# Patient Record
Sex: Female | Born: 1970 | Race: Black or African American | Hispanic: No | Marital: Married | Smoking: Former smoker
Health system: Southern US, Community
[De-identification: ages and names within clinical notes are randomized; demographics above are authoritative.]

## PROBLEM LIST (undated history)

## (undated) DIAGNOSIS — N83209 Unspecified ovarian cyst, unspecified side: Secondary | ICD-10-CM

## (undated) DIAGNOSIS — D259 Leiomyoma of uterus, unspecified: Secondary | ICD-10-CM

## (undated) DIAGNOSIS — D649 Anemia, unspecified: Secondary | ICD-10-CM

## (undated) DIAGNOSIS — K219 Gastro-esophageal reflux disease without esophagitis: Secondary | ICD-10-CM

## (undated) HISTORY — PX: LAPAROSCOPY: SHX197

---

## 2006-08-16 ENCOUNTER — Emergency Department (HOSPITAL_COMMUNITY): Admission: EM | Admit: 2006-08-16 | Discharge: 2006-08-16 | Payer: Self-pay | Admitting: Family Medicine

## 2007-08-06 ENCOUNTER — Ambulatory Visit: Payer: Self-pay | Admitting: Oncology

## 2007-08-08 ENCOUNTER — Inpatient Hospital Stay (HOSPITAL_COMMUNITY): Admission: AD | Admit: 2007-08-08 | Discharge: 2007-08-08 | Payer: Self-pay | Admitting: Obstetrics

## 2007-08-13 ENCOUNTER — Emergency Department (HOSPITAL_COMMUNITY): Admission: EM | Admit: 2007-08-13 | Discharge: 2007-08-14 | Payer: Self-pay | Admitting: Emergency Medicine

## 2007-08-14 ENCOUNTER — Ambulatory Visit: Payer: Self-pay | Admitting: Oncology

## 2007-09-05 ENCOUNTER — Ambulatory Visit: Payer: Self-pay | Admitting: Oncology

## 2007-11-03 ENCOUNTER — Emergency Department (HOSPITAL_COMMUNITY): Admission: EM | Admit: 2007-11-03 | Discharge: 2007-11-03 | Payer: Self-pay | Admitting: Family Medicine

## 2008-07-08 ENCOUNTER — Ambulatory Visit (HOSPITAL_COMMUNITY): Admission: RE | Admit: 2008-07-08 | Discharge: 2008-07-08 | Payer: Self-pay | Admitting: Obstetrics & Gynecology

## 2008-07-23 ENCOUNTER — Ambulatory Visit (HOSPITAL_COMMUNITY): Admission: RE | Admit: 2008-07-23 | Discharge: 2008-07-23 | Payer: Self-pay | Admitting: Obstetrics & Gynecology

## 2008-11-01 ENCOUNTER — Emergency Department (HOSPITAL_COMMUNITY): Admission: EM | Admit: 2008-11-01 | Discharge: 2008-11-02 | Payer: Self-pay | Admitting: Emergency Medicine

## 2009-03-22 ENCOUNTER — Emergency Department (HOSPITAL_COMMUNITY): Admission: EM | Admit: 2009-03-22 | Discharge: 2009-03-22 | Payer: Self-pay | Admitting: Emergency Medicine

## 2009-04-03 IMAGING — US US TRANSVAGINAL NON-OB
1 series · 13 of 25 positions shown · non-contrast
Comparison: None

CLINICAL DATA: Right lower quadrant pain.  Evaluate for ovarian
cyst.  LMP 07/01/2008

TRANSABDOMINAL AND TRANSVAGINAL ULTRASOUND OF PELVIS
TECHNIQUE: Both transabdominal and transvaginal ultrasound
examinations of the pelvis were performed including evaluation of
the uterus, ovaries, adnexal regions, and pelvic cul-de-sac.

[Series 1: us transvaginal non-ob · 13 of 73 slices shown]
[im 1/73]
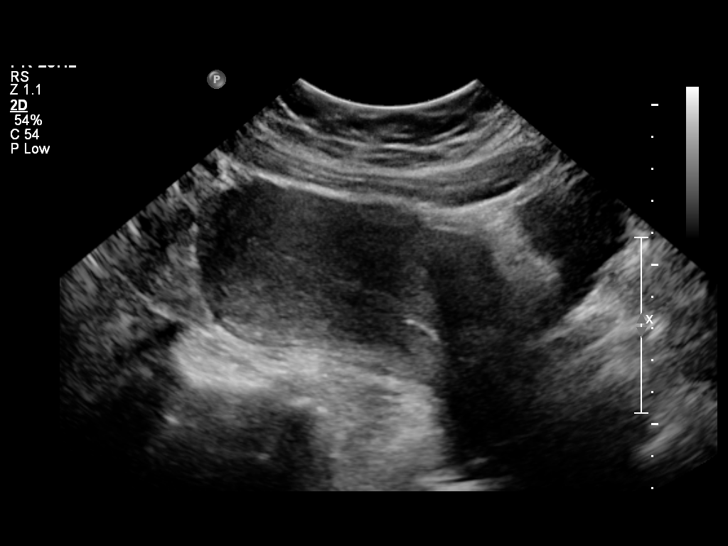
[im 7/73]
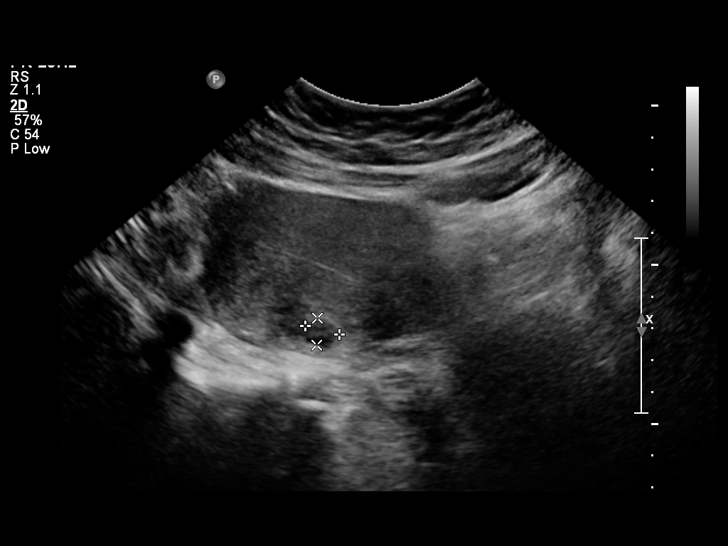
[im 13/73]
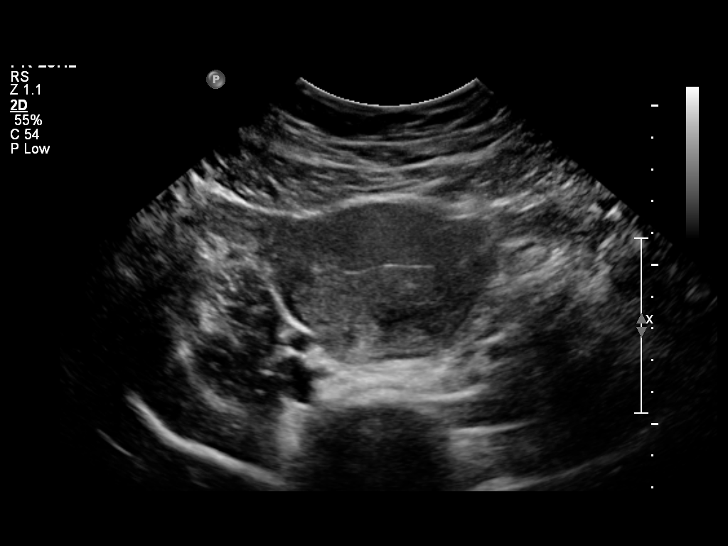
[im 19/73]
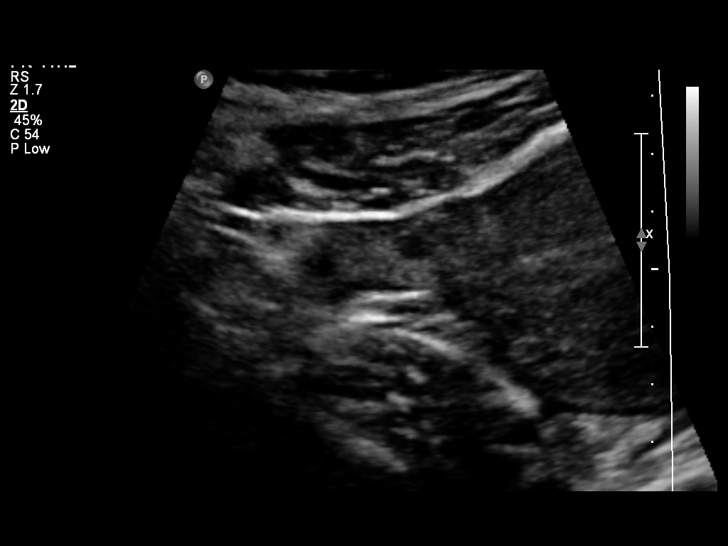
[im 25/73]
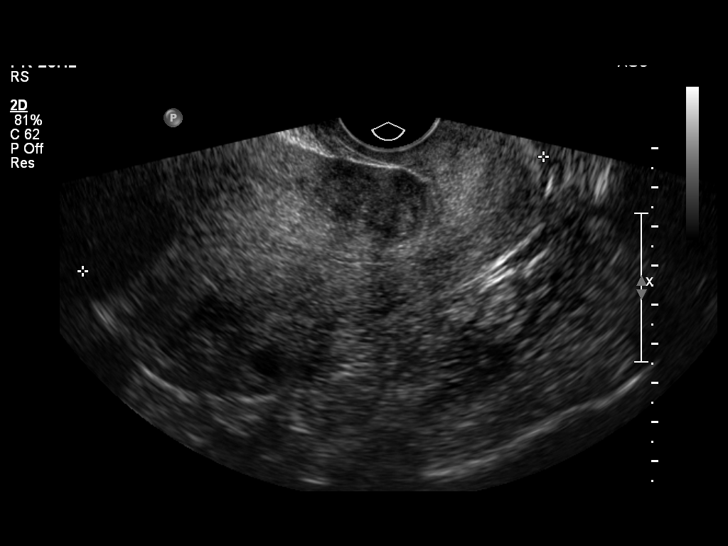
[im 31/73]
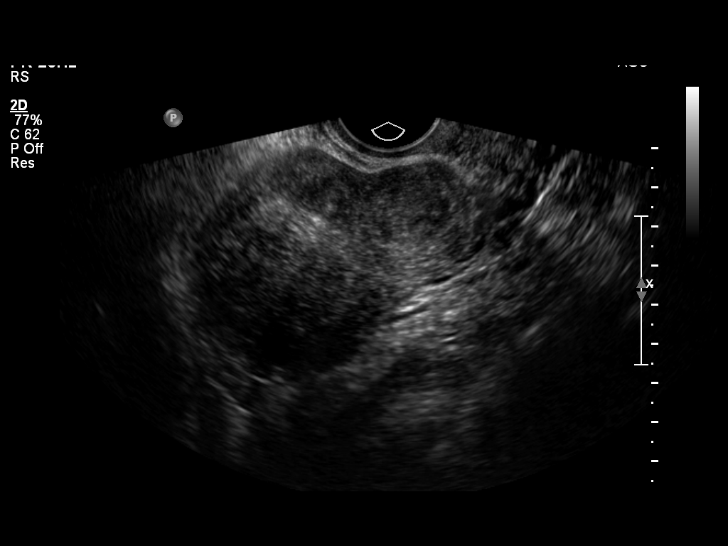
[im 37/73]
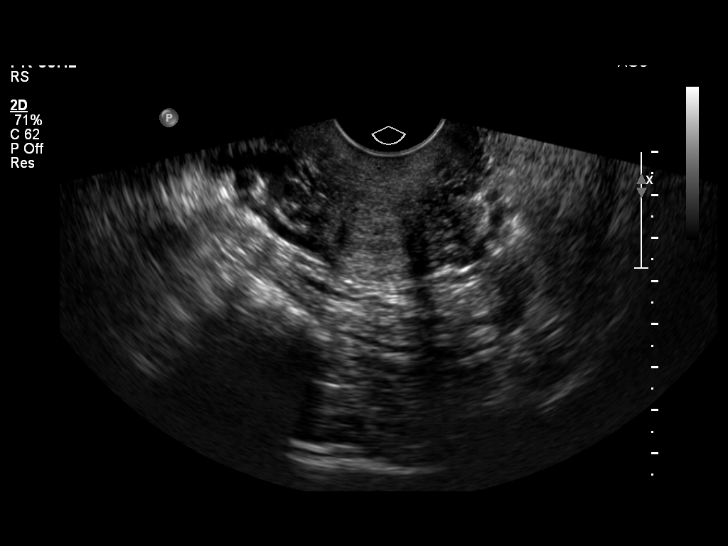
[im 43/73]
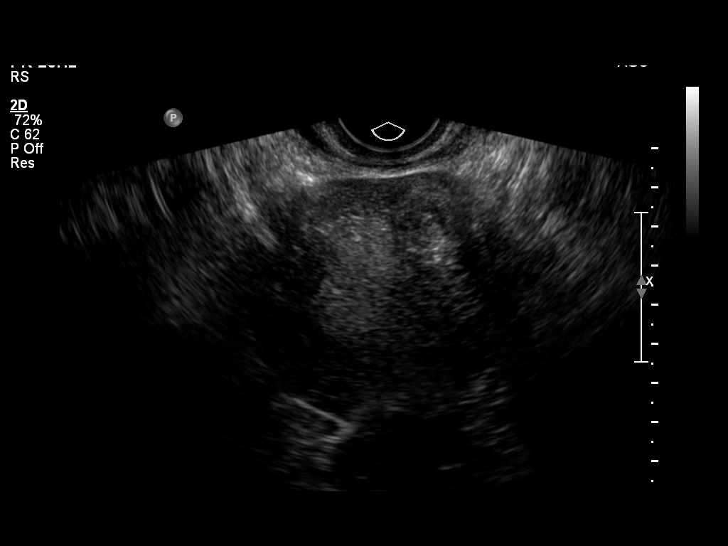
[im 49/73]
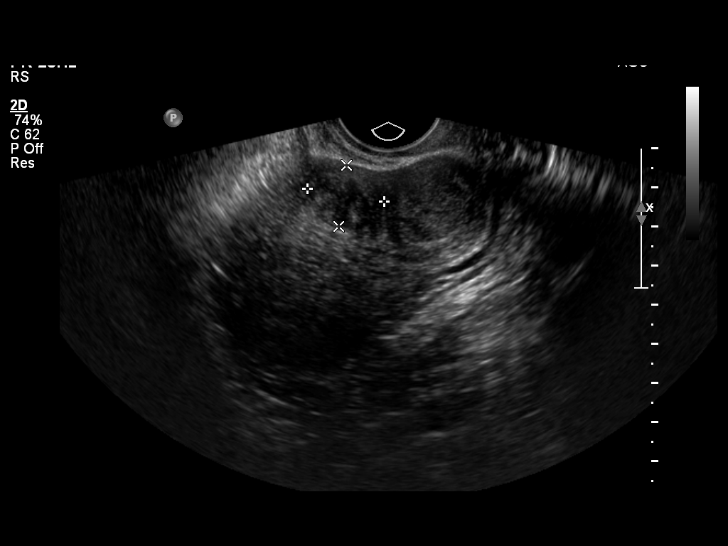
[im 55/73]
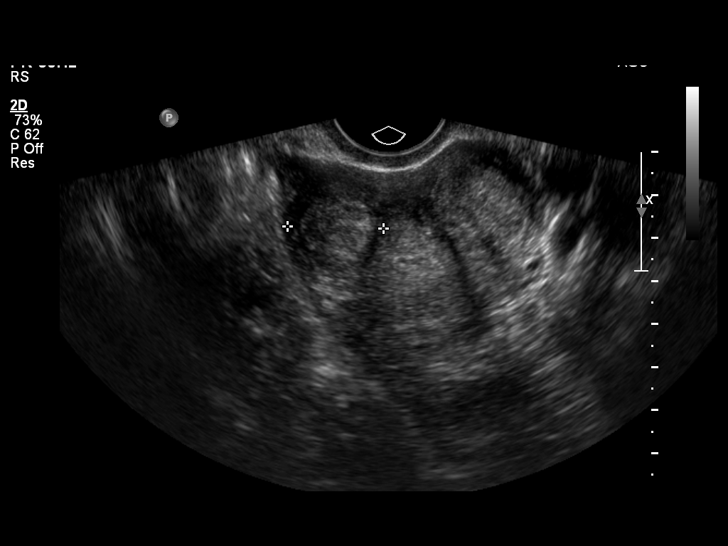
[im 61/73]
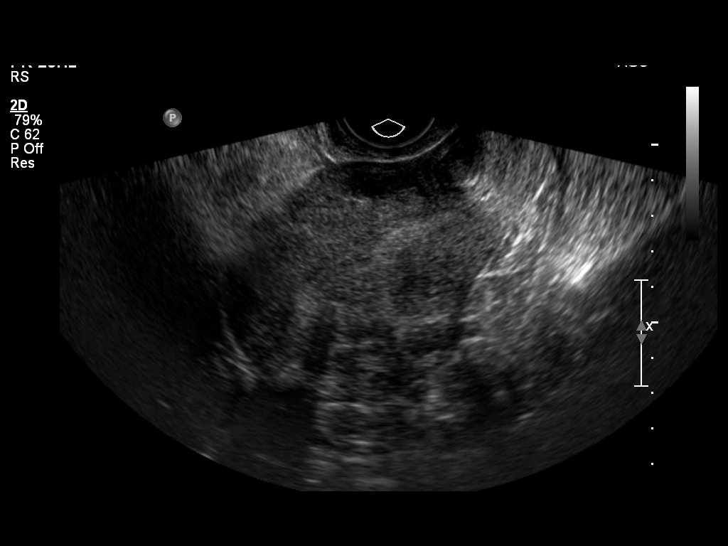
[im 67/73]
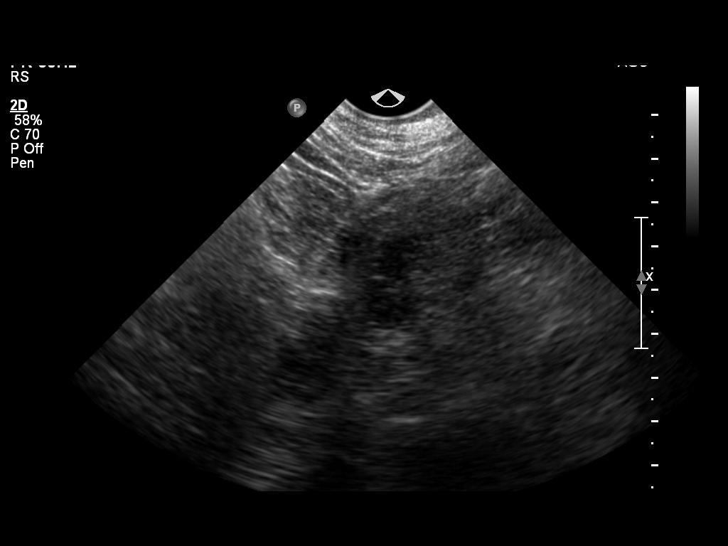
[im 73/73]
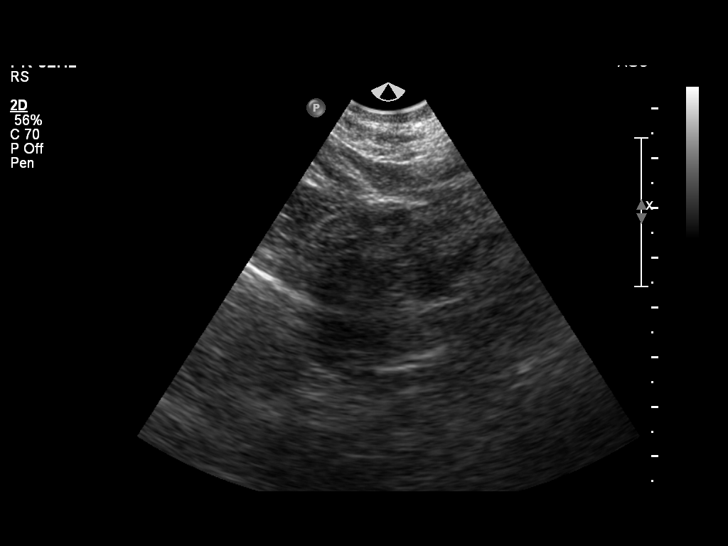

[13 of 25 positions shown; findings below may reference images not displayed]

FINDINGS: The uterus has a sagittal length of 12.1 cm, an AP width
of 6.5 cm and a transverse width of 6.7 cm.  Multiple focal
fibroids are identified and have sizes and locations as follows:
In the anterior left lateral lower uterine segment measuring 3.0 x
2.2 x 2.7 cm and mural in location, in the left anterolateral upper
uterine segment measuring 2.0 x 1.6 x 1.9 cm and mural in location,
in the anterolateral right lower uterine segment measuring 1.7 x
1.4 x 2.2 cm and mural in location, in the right lateral mid body
measuring 2.7 x 2.4 x 2.2 cm and mural in location, in the
posterior and right lateral upper uterine segment measuring 1.4 x
0.8 x 1.5 cm and mural in location.  A small fibroid is identified
centrally 7 x 6 x 7 mm and appearing to be predominantly submucosal
in location.

The endometrial lining with the exception of the small focal
fibroid is homogeneously echogenic with an AP width of 9.5 mm.  No
areas of focal thickening or inhomogeneity are noted.

Both ovaries are seen and have a normal appearance of the right
ovary measuring 3.1 x 1.3 x 2.2 cm and the left ovary measuring
x 1.7 x 2.5 cm.

No pelvic fluid is seen.
IMPRESSION: Fibroid uterus with fibroid sizes and locations as noted above.
One small fibroid appears to be submucosal in location.  This can
be confirmed with sono hysterography if desired.  Normal ovaries.

## 2009-05-04 ENCOUNTER — Emergency Department (HOSPITAL_COMMUNITY): Admission: EM | Admit: 2009-05-04 | Discharge: 2009-05-04 | Payer: Self-pay | Admitting: Family Medicine

## 2009-05-26 ENCOUNTER — Emergency Department (HOSPITAL_COMMUNITY): Admission: EM | Admit: 2009-05-26 | Discharge: 2009-05-26 | Payer: Self-pay | Admitting: Family Medicine

## 2009-08-08 ENCOUNTER — Emergency Department (HOSPITAL_COMMUNITY): Admission: EM | Admit: 2009-08-08 | Discharge: 2009-08-08 | Payer: Self-pay | Admitting: Family Medicine

## 2009-08-19 ENCOUNTER — Encounter: Admission: RE | Admit: 2009-08-19 | Discharge: 2009-08-19 | Payer: Self-pay | Admitting: Orthopedic Surgery

## 2010-05-28 ENCOUNTER — Encounter: Payer: Self-pay | Admitting: Obstetrics & Gynecology

## 2010-07-26 ENCOUNTER — Emergency Department (INDEPENDENT_AMBULATORY_CARE_PROVIDER_SITE_OTHER): Payer: 59

## 2010-07-26 ENCOUNTER — Emergency Department (HOSPITAL_BASED_OUTPATIENT_CLINIC_OR_DEPARTMENT_OTHER)
Admission: EM | Admit: 2010-07-26 | Discharge: 2010-07-26 | Disposition: A | Discharge status: Home / Self Care | Payer: 59 | Attending: Emergency Medicine | Admitting: Emergency Medicine

## 2010-07-26 DIAGNOSIS — F17200 Nicotine dependence, unspecified, uncomplicated: Secondary | ICD-10-CM | POA: Insufficient documentation

## 2010-07-26 DIAGNOSIS — R1011 Right upper quadrant pain: Secondary | ICD-10-CM

## 2010-07-26 LAB — COMPREHENSIVE METABOLIC PANEL
BUN: 8 mg/dL (ref 6–23)
CO2: 25 mEq/L (ref 19–32)
Calcium: 9.5 mg/dL (ref 8.4–10.5)
Creatinine, Ser: 0.8 mg/dL (ref 0.4–1.2)
GFR calc Af Amer: >60 mL/min (ref >60)
GFR calc non Af Amer: >60 mL/min (ref >60)
Glucose, Bld: 94 mg/dL (ref 70–99)

## 2010-07-26 LAB — POCT PREGNANCY, URINE: Preg Test, Ur: NEGATIVE

## 2010-07-26 LAB — URINALYSIS, ROUTINE W REFLEX MICROSCOPIC
Ketones, ur: NEGATIVE mg/dL
Nitrite: NEGATIVE
Protein, ur: NEGATIVE mg/dL
Urobilinogen, UA: 0.2 mg/dL (ref 0.0–1.0)

## 2010-07-26 LAB — CBC
HCT: 35.7 % — ABNORMAL LOW (ref 36.0–46.0)
Hemoglobin: 12.1 g/dL (ref 12.0–15.0)
MCH: 28.5 pg (ref 26.0–34.0)
MCHC: 33.9 g/dL (ref 30.0–36.0)
MCV: 84.2 fL (ref 78.0–100.0)

## 2010-07-26 LAB — DIFFERENTIAL
Basophils Relative: 0 % (ref 0–1)
Eosinophils Absolute: 0.3 10*3/uL (ref 0.0–0.7)
Monocytes Absolute: 0.7 10*3/uL (ref 0.1–1.0)
Neutrophils Relative %: 35 % — ABNORMAL LOW (ref 43–77)

## 2010-08-09 LAB — POCT RAPID STREP A (OFFICE): Streptococcus, Group A Screen (Direct): NEGATIVE

## 2016-07-18 ENCOUNTER — Observation Stay (HOSPITAL_COMMUNITY)
Admission: EM | Admit: 2016-07-18 | Discharge: 2016-07-19 | Disposition: A | Discharge status: Home / Self Care | Attending: Internal Medicine | Admitting: Internal Medicine

## 2016-07-18 ENCOUNTER — Encounter (HOSPITAL_COMMUNITY): Payer: Self-pay

## 2016-07-18 ENCOUNTER — Observation Stay (HOSPITAL_COMMUNITY)

## 2016-07-18 DIAGNOSIS — R0789 Other chest pain: Secondary | ICD-10-CM | POA: Diagnosis not present

## 2016-07-18 DIAGNOSIS — Z8249 Family history of ischemic heart disease and other diseases of the circulatory system: Secondary | ICD-10-CM | POA: Diagnosis not present

## 2016-07-18 DIAGNOSIS — R1013 Epigastric pain: Secondary | ICD-10-CM | POA: Diagnosis present

## 2016-07-18 DIAGNOSIS — Z79899 Other long term (current) drug therapy: Secondary | ICD-10-CM | POA: Diagnosis not present

## 2016-07-18 DIAGNOSIS — D219 Benign neoplasm of connective and other soft tissue, unspecified: Secondary | ICD-10-CM | POA: Diagnosis present

## 2016-07-18 DIAGNOSIS — D500 Iron deficiency anemia secondary to blood loss (chronic): Secondary | ICD-10-CM | POA: Diagnosis not present

## 2016-07-18 DIAGNOSIS — E876 Hypokalemia: Secondary | ICD-10-CM | POA: Diagnosis not present

## 2016-07-18 DIAGNOSIS — K219 Gastro-esophageal reflux disease without esophagitis: Secondary | ICD-10-CM | POA: Diagnosis not present

## 2016-07-18 DIAGNOSIS — Z9102 Food additives allergy status: Secondary | ICD-10-CM | POA: Insufficient documentation

## 2016-07-18 DIAGNOSIS — R079 Chest pain, unspecified: Secondary | ICD-10-CM | POA: Diagnosis present

## 2016-07-18 DIAGNOSIS — N920 Excessive and frequent menstruation with regular cycle: Secondary | ICD-10-CM | POA: Diagnosis not present

## 2016-07-18 DIAGNOSIS — Z833 Family history of diabetes mellitus: Secondary | ICD-10-CM | POA: Diagnosis not present

## 2016-07-18 DIAGNOSIS — Z791 Long term (current) use of non-steroidal anti-inflammatories (NSAID): Secondary | ICD-10-CM | POA: Diagnosis not present

## 2016-07-18 DIAGNOSIS — D649 Anemia, unspecified: Secondary | ICD-10-CM | POA: Diagnosis not present

## 2016-07-18 DIAGNOSIS — Z720 Tobacco use: Secondary | ICD-10-CM

## 2016-07-18 DIAGNOSIS — R072 Precordial pain: Secondary | ICD-10-CM

## 2016-07-18 DIAGNOSIS — D259 Leiomyoma of uterus, unspecified: Secondary | ICD-10-CM | POA: Diagnosis present

## 2016-07-18 DIAGNOSIS — F17200 Nicotine dependence, unspecified, uncomplicated: Secondary | ICD-10-CM | POA: Diagnosis not present

## 2016-07-18 HISTORY — DX: Leiomyoma of uterus, unspecified: D25.9

## 2016-07-18 LAB — IRON AND TIBC
Iron: 20 ug/dL — ABNORMAL LOW (ref 28–170)
Saturation Ratios: 4 % — ABNORMAL LOW (ref 10.4–31.8)
TIBC: 477 ug/dL — ABNORMAL HIGH (ref 250–450)
UIBC: 457 ug/dL

## 2016-07-18 LAB — COMPREHENSIVE METABOLIC PANEL
ALT: 11 U/L — ABNORMAL LOW (ref 14–54)
ANION GAP: 7 (ref 5–15)
AST: 13 U/L — ABNORMAL LOW (ref 15–41)
Albumin: 4.1 g/dL (ref 3.5–5.0)
Alkaline Phosphatase: 37 U/L — ABNORMAL LOW (ref 38–126)
BUN: 5 mg/dL — ABNORMAL LOW (ref 6–20)
CHLORIDE: 105 mmol/L (ref 101–111)
CO2: 26 mmol/L (ref 22–32)
Calcium: 9.2 mg/dL (ref 8.9–10.3)
Creatinine, Ser: 0.89 mg/dL (ref 0.44–1.00)
GFR calc non Af Amer: >60 mL/min (ref >60)
Glucose, Bld: 117 mg/dL — ABNORMAL HIGH (ref 65–99)
POTASSIUM: 3.2 mmol/L — AB (ref 3.5–5.1)
Sodium: 138 mmol/L (ref 135–145)
Total Bilirubin: 0.3 mg/dL (ref 0.3–1.2)
Total Protein: 6.7 g/dL (ref 6.5–8.1)

## 2016-07-18 LAB — CBC
HCT: 23.8 % — ABNORMAL LOW (ref 36.0–46.0)
HCT: 23.3 % — ABNORMAL LOW (ref 36.0–46.0)
HEMOGLOBIN: 6.3 g/dL — AB (ref 12.0–15.0)
Hemoglobin: 6.5 g/dL — CL (ref 12.0–15.0)
MCH: 16.2 pg — ABNORMAL LOW (ref 26.0–34.0)
MCH: 17.8 pg — ABNORMAL LOW (ref 26.0–34.0)
MCHC: 26.5 g/dL — ABNORMAL LOW (ref 30.0–36.0)
MCHC: 27.9 g/dL — AB (ref 30.0–36.0)
MCV: 61.3 fL — AB (ref 78.0–100.0)
MCV: 63.8 fL — ABNORMAL LOW (ref 78.0–100.0)
PLATELETS: 658 10*3/uL — AB (ref 150–400)
PLATELETS: 544 10*3/uL — AB (ref 150–400)
RBC: 3.88 MIL/uL (ref 3.87–5.11)
RBC: 3.65 MIL/uL — ABNORMAL LOW (ref 3.87–5.11)
RDW: 19.9 % — ABNORMAL HIGH (ref 11.5–15.5)
RDW: 21.8 % — AB (ref 11.5–15.5)
WBC: 5.2 10*3/uL (ref 4.0–10.5)
WBC: 6.3 10*3/uL (ref 4.0–10.5)

## 2016-07-18 LAB — FERRITIN: Ferritin: 2 ng/mL — ABNORMAL LOW (ref 11–307)

## 2016-07-18 LAB — FOLATE: FOLATE: 17.9 ng/mL (ref >5.9)

## 2016-07-18 LAB — RETICULOCYTES
RBC.: 3.72 MIL/uL — AB (ref 3.87–5.11)
RETIC COUNT ABSOLUTE: 40.9 10*3/uL (ref 19.0–186.0)
RETIC CT PCT: 1.1 % (ref 0.4–3.1)

## 2016-07-18 LAB — PREPARE RBC (CROSSMATCH)

## 2016-07-18 LAB — TROPONIN I

## 2016-07-18 LAB — ABO/RH: ABO/RH(D): A POS

## 2016-07-18 LAB — VITAMIN B12: Vitamin B-12: 633 pg/mL (ref 180–914)

## 2016-07-18 LAB — POC OCCULT BLOOD, ED: FECAL OCCULT BLD: NEGATIVE

## 2016-07-18 MED ORDER — GI COCKTAIL ~~LOC~~
30.0000 mL | Freq: Once | ORAL | Status: AC
  Administered 2016-07-18: 30 mL via ORAL
  Filled 2016-07-18: qty 30

## 2016-07-18 MED ORDER — PANTOPRAZOLE SODIUM 40 MG PO TBEC
40.0000 mg | DELAYED_RELEASE_TABLET | Freq: Two times a day (BID) | ORAL | Status: DC
  Administered 2016-07-18 – 2016-07-19 (×2): 40 mg via ORAL
  Filled 2016-07-18 (×2): qty 1

## 2016-07-18 MED ORDER — SODIUM CHLORIDE 0.9 % IV SOLN
10.0000 mL/h | Freq: Once | INTRAVENOUS | Status: AC
  Administered 2016-07-18: 10 mL/h via INTRAVENOUS

## 2016-07-18 NOTE — ED Triage Notes (Addendum)
Pt states she was seen by her PCP for chest pain last week and told it was nothing cardiac but was called and told her Hgb was low. She reports it was less than 6. Pt reports increased fatigue. Pt denies rectal bleeding. Hx of ulcer.

## 2016-07-18 NOTE — ED Notes (Signed)
Patient transported to Ultrasound 

## 2016-07-18 NOTE — ED Provider Notes (Signed)
[facility] DEPT Provider Note   CSN: [id number] Arrival date & time: 07/18/16  1416     History   Chief Complaint Chief Complaint  Patient presents with  . Abnormal Lab    HPI Desiree Nunez is a 46 y.o. female.  HPI  46 y.o woman comes in with cc of low Hb. Pt has hx of fibroids and menorrhagia. She reports that for the past couple of months she has been having intermittent chest pain. Pt also has been feeling fatigued, so she went to her PCP and she was asked to come to the ER as her Hb was low.  Pt's chest pain is lower chest. She has some pain in her L arm as well. Pt's pain is fairly constant, and it is not worse with palpation, exertion or deep inspiration. Pt does smoke 4 cigarettes /day (used to smoke 2-3 ppd when younger) and has family hx of CAD at age 16s. Otherwise no medical problems.   Pt denies any heavy nsaid use, alcohol abuse, dark tarry stools. No active bleeding. ROS is + for rash to her tongue. No bleeding gingiva or diffuse bruising.  Past Medical History:  Diagnosis Date  . Uterine fibroid     There are no active problems to display for this patient.   History reviewed. No pertinent surgical history.  OB History    No data available       Home Medications    Prior to Admission medications   Medication Sig Start Date End Date Taking? Authorizing Provider  naproxen sodium (ANAPROX) 220 MG tablet Take 440 mg by mouth 2 (two) times daily as needed (for pain).   Yes Historical Provider, MD  ondansetron (ZOFRAN-ODT) 8 MG disintegrating tablet Take 8 mg by mouth every 8 (eight) hours as needed for nausea or vomiting.  07/17/16  Yes Historical Provider, MD    Family History History reviewed. No pertinent family history.  Social History Social History  Substance Use Topics  . Smoking status: Current Every Day Smoker    Packs/day: 0.50  . Smokeless tobacco: Never Used  . Alcohol use Yes     Comment: rare     Allergies   Strawberry  flavor   Review of Systems Review of Systems  Constitutional: Positive for fatigue.  Cardiovascular: Positive for chest pain.  Genitourinary: Positive for vaginal bleeding.  Skin: Positive for rash.   ROS 10 Systems reviewed and are negative for acute change except as noted in the HPI.     Physical Exam Updated Vital Signs BP 102/61   Pulse 64   Temp 99 F (37.2 C) (Oral)   Resp 18   LMP 07/01/2016 (Within Days)   SpO2 100%   Physical Exam  Constitutional: She is oriented to person, place, and time. She appears well-developed and well-nourished.  HENT:  Head: Normocephalic and atraumatic.  Eyes: EOM are normal. Pupils are equal, round, and reactive to light.  Neck: Neck supple.  Cardiovascular: Normal rate, regular rhythm and normal heart sounds.   No murmur heard. Pulmonary/Chest: Effort normal. No respiratory distress.  Abdominal: Soft. She exhibits no distension. There is no tenderness. There is no rebound and no guarding.  Neurological: She is alert and oriented to person, place, and time.  Skin: Skin is warm and dry.  Nursing note and vitals reviewed.    ED Treatments / Results  Labs (all labs ordered are listed, but only abnormal results are displayed) Labs Reviewed  COMPREHENSIVE METABOLIC PANEL - Abnormal; Notable  for the following:       Result Value   Potassium 3.2 (*)    Glucose, Bld 117 (*)    BUN 5 (*)    AST 13 (*)    ALT 11 (*)    Alkaline Phosphatase 37 (*)    All other components within normal limits  CBC - Abnormal; Notable for the following:    Hemoglobin 6.3 (*)    HCT 23.8 (*)    MCV 61.3 (*)    MCH 16.2 (*)    MCHC 26.5 (*)    RDW 19.9 (*)    Platelets 658 (*)    All other components within normal limits  RETICULOCYTES - Abnormal; Notable for the following:    RBC. 3.72 (*)    All other components within normal limits  VITAMIN B12  FOLATE  IRON AND TIBC  FERRITIN  TROPONIN I  TYPE AND SCREEN  ABO/RH  PREPARE RBC  (CROSSMATCH)    EKG  EKG Interpretation  Date/Time:  Wednesday July 18 2016 14:35:45 EDT Ventricular Rate:  77 PR Interval:  134 QRS Duration: 78 QT Interval:  366 QTC Calculation: 414 R Axis:   61 Text Interpretation:  Normal sinus rhythm Low voltage QRS Septal infarct , age undetermined Abnormal ECG No acute changes No old tracing to compare Confirmed by Kathrynn Humble, MD, Thelma Comp [phone]) on 07/18/2016 4:04:57 PM       Radiology No results found.  Procedures Procedures (including critical care time)  CRITICAL CARE Performed by: Varney Biles   Total critical care time: 35 minutes  Critical care time was exclusive of separately billable procedures and treating other patients.  Critical care was necessary to treat or prevent imminent or life-threatening deterioration.  Critical care was time spent personally by me on the following activities: development of treatment plan with patient and/or surrogate as well as nursing, discussions with consultants, evaluation of patient's response to treatment, examination of patient, obtaining history from patient or surrogate, ordering and performing treatments and interventions, ordering and review of laboratory studies, ordering and review of radiographic studies, pulse oximetry and re-evaluation of patient's condition.   Medications Ordered in ED Medications  0.9 %  sodium chloride infusion (10 mL/hr Intravenous New Bag/Given 07/18/16 1638)     Initial Impression / Assessment and Plan / ED Course  I have reviewed the triage vital signs and the nursing notes.  Pertinent labs & imaging results that were available during my care of the patient were reviewed by me and considered in my medical decision making (see chart for details).     Pt comes in with cc of low Hb. Pt is having symptomatic anemia - likely from chronic blood loss. We will start transfusion and admit to the hospital.    Final Clinical Impressions(s) / ED Diagnoses    Final diagnoses:  Symptomatic anemia  Precordial chest pain    New Prescriptions New Prescriptions   No medications on file     Varney Biles, MD 07/18/16 2117

## 2016-07-18 NOTE — ED Notes (Signed)
ED Provider at bedside. 

## 2016-07-18 NOTE — H&P (Addendum)
Desiree Nunez TZG:[id number] DOB: 05-Jun-1970 DOA: 07/18/2016     PCP: [facility]   Outpatient Specialists: OBGYN Patient coming from:    home Lives  With family    Chief Complaint: Low hemoglobin  HPI: Desiree Nunez is a 46 y.o. female with medical history significant of Fibroids metrorrhagia      Presented with progressive shortness of breath and occasional chest pain. She continues to smoke. Diffuse she felt it was muscular, She had some dull discomfort in her left arm. She also describes epigastric pain that has been persistent for days worse with eating improved after Zofran. Was associated with nausea just a bit. Patient has history of peptic ulcer disease years ago. Recently he has been taking NSAIDs. Denies melena states today her stool has been white. Endorses losing weight without trying. patient went to her doctor to have this checked out to routine blood work showed low hemoglobin and she was asked to present to emergency department. She have have heavy periods lasting 7 days. Goes through 10 pads.  Denies any rectal bleeding. Report PICA has been eating ice.  Reports dyspnea with exertion.  In  the past has been told that she has fibroids and twill follow up with OBGYN   IN ER:  Temp (24hrs), Avg:99 F (37.2 C), Min:98.6 F (37 C), Max:99.3 F (37.4 C)     RR 18 HR 64 BP 102/61 Ferritin 2 Retic 1.1  Na 138 K 3.2 WBC 5.2 PLT 658  Hgb 6.3   Following Medications were ordered in ER: Medications  0.9 %  sodium chloride infusion (10 mL/hr Intravenous New Bag/Given 07/18/16 1638)     ER provider discussed case with:[facility] was advised admission to the hospitalist service  Hospitalist was called for admission for symptomatic iron deficiency anemia  Review of Systems:    Pertinent positives include: Chest pain/Epigastric pain shortness of breath weight loss   Constitutional:  No weight loss, night sweats, Fevers, chills,  fatigue, HEENT:  No headaches, Difficulty swallowing,Tooth/dental problems,Sore throat,  No sneezing, itching, ear ache, nasal congestion, post nasal drip,  Cardio-vascular:  No  Orthopnea, PND, anasarca, dizziness, palpitations.no Bilateral lower extremity swelling  GI:  No heartburn, indigestion, abdominal pain, nausea, vomiting, diarrhea, change in bowel habits, loss of appetite, melena, blood in stool, hematemesis Resp:    No excess mucus, no productive cough, No non-productive cough, No coughing up of blood.No change in color of mucus.No wheezing. Skin:  no rash or lesions. No jaundice GU:  no dysuria, change in color of urine, no urgency or frequency. No straining to urinate.  No flank pain.  Musculoskeletal:  No joint pain or no joint swelling. No decreased range of motion. No back pain.  Psych:  No change in mood or affect. No depression or anxiety. No memory loss.  Neuro: no localizing neurological complaints, no tingling, no weakness, no double vision, no gait abnormality, no slurred speech, no confusion  As per HPI otherwise 10 point review of systems negative.   Past Medical History: Past Medical History:  Diagnosis Date  . Uterine fibroid    History reviewed. No pertinent surgical history.   Social History:  Ambulatory   Independently     reports that she has been smoking.  She has been smoking about 0.50 packs per day. She has never used smokeless tobacco. She reports that she drinks alcohol. Her drug history is not on file.  Allergies:   Allergies  Allergen Reactions  .  Strawberry Flavor Swelling       Family History:   Family History  Problem Relation Age of Onset  . CAD Father   . Hypertension Sister   . Diabetes Other   . Cancer Neg Hx     Medications: Prior to Admission medications   Medication Sig Start Date End Date Taking? Authorizing Provider  naproxen sodium (ANAPROX) 220 MG tablet Take 440 mg by mouth 2 (two) times daily as needed  (for pain).   Yes Historical Provider, MD  ondansetron (ZOFRAN-ODT) 8 MG disintegrating tablet Take 8 mg by mouth every 8 (eight) hours as needed for nausea or vomiting.  07/17/16  Yes Historical Provider, MD    Physical Exam: Patient Vitals for the past 24 hrs:  BP Temp Temp src Pulse Resp SpO2  07/18/16 1715 102/61 - - 64 18 100 %  07/18/16 1700 96/56 - - 66 21 100 %  07/18/16 1655 (!) 94/53 99 F (37.2 C) Oral 65 20 -  07/18/16 1645 104/59 - - 64 15 100 %  07/18/16 1640 101/80 99.3 F (37.4 C) Oral 66 18 -  07/18/16 1630 101/60 - - 69 19 100 %  07/18/16 1619 104/68 - - 68 15 99 %  07/18/16 1432 111/64 98.6 F (37 C) Oral 69 18 100 %    1. General:  in No Acute distress 2. Psychological: Alert and    Oriented 3. Head/ENT:    Dry Mucous Membranes pale                          Head Non traumatic, neck supple                          Normal  Dentition 4. SKIN:   decreased Skin turgor,  Skin clean Dry and intact no rash 5. Heart: Regular rate and rhythm no  Murmur, Rub or gallop 6. Lungs:  no wheezes or crackles   7. Abdomen: Soft,  Epigastric and RUQ tenderness, Non distended 8. Lower extremities: no clubbing, cyanosis, or edema 9. Neurologically Grossly intact, moving all 4 extremities equally   10. MSK: Normal range of motion   body mass index is unknown because there is no height or weight on file.  Labs on Admission:   Labs on Admission: I have personally reviewed following labs and imaging studies  CBC:  Recent Labs Lab 07/18/16 1439  WBC 5.2  HGB 6.3*  HCT 23.8*  MCV 61.3*  PLT 616*   Basic Metabolic Panel:  Recent Labs Lab 07/18/16 1439  NA 138  K 3.2*  CL 105  CO2 26  GLUCOSE 117*  BUN 5*  CREATININE 0.89  CALCIUM 9.2   GFR: CrCl cannot be calculated (Unknown ideal weight.). Liver Function Tests:  Recent Labs Lab 07/18/16 1439  AST 13*  ALT 11*  ALKPHOS 37*  BILITOT 0.3  PROT 6.7  ALBUMIN 4.1   No results for input(s): LIPASE,  AMYLASE in the last 168 hours. No results for input(s): AMMONIA in the last 168 hours. Coagulation Profile: No results for input(s): INR, PROTIME in the last 168 hours. Cardiac Enzymes: No results for input(s): CKTOTAL, CKMB, CKMBINDEX, TROPONINI in the last 168 hours. BNP (last 3 results) No results for input(s): PROBNP in the last 8760 hours. HbA1C: No results for input(s): HGBA1C in the last 72 hours. CBG: No results for input(s): GLUCAP in the last 168 hours. Lipid Profile:  No results for input(s): CHOL, HDL, LDLCALC, TRIG, CHOLHDL, LDLDIRECT in the last 72 hours. Thyroid Function Tests: No results for input(s): TSH, T4TOTAL, FREET4, T3FREE, THYROIDAB in the last 72 hours. Anemia Panel:  Recent Labs  07/18/16 1619  VITAMINB12 633  FOLATE 17.9  FERRITIN 2*  TIBC 477*  IRON 20*  RETICCTPCT 1.1    Sepsis Labs: @LABRCNTIP (procalcitonin:4,lacticidven:4) )No results found for this or any previous visit (from the past 240 hour(s)).    UA  not ordered  No results found for: HGBA1C  CrCl cannot be calculated (Unknown ideal weight.).  BNP (last 3 results) No results for input(s): PROBNP in the last 8760 hours.   ECG REPORT  Independently reviewed Rate: 77   Rhythm: NSR ST&T Change: No acute ischemic changes  QTC 414   There were no vitals filed for this visit.   Cultures: No results found for: [place], [place], [place], REPTSTATUS   Radiological Exams on Admission: No results found.  Chart has been reviewed    Assessment/Plan   46 y.o. female with medical history significant of Fibroids metrorrhagia admitted for symptomatic anemia       Present on Admission: . Symptomatic anemia - transfuse 1 unit, follow CBC will need iron supplementation given severity may need to be referred to Hematology  . Chest pain - Resolved will cycle cardiac enzymes and monitor on telemetry obtain echogram . Tobacco abuse spoke about importance of quitting . Hypokalemia replace  and check magnesium level . Fibroids will need to follow-up with OB/GYN obtain ultrasound Epigastric pain - given NSAID use history of peptic ulcer disease and iron deficiency anemia will obtain Hemoccult stool if positive she'll need GI consult. Administer GI cocktail. Given weight loss and persistent epigastric discomfort patient will likely benefit from GI follow-up States lipase was checked yesterday by PCP and was negative Other plan as per orders.  DVT prophylaxis:  SCD     Code Status:  FULL CODE  as per patient    Family Communication:   Family  at  Bedside  plan of care was discussed with Sister  Disposition Plan:     To home once workup is complete and patient is stable   Consults called: none  Admission status:     obs   Level of care    tele      I have spent a total of 56 min on this admission   Toniya Rozar 07/18/2016, 8:28 PM    [facility]  Pager [phone]   after 2 AM please page floor coverage PA If 7AM-7PM, please contact the day team taking care of the patient  [email or web address]  Password [email or web address]

## 2016-07-18 NOTE — ED Notes (Signed)
No addl blood draw,  Pt soon to go to inpatient.

## 2016-07-18 NOTE — ED Notes (Signed)
Nurse drawing labs. 

## 2016-07-19 ENCOUNTER — Other Ambulatory Visit (HOSPITAL_COMMUNITY)

## 2016-07-19 DIAGNOSIS — R0789 Other chest pain: Secondary | ICD-10-CM

## 2016-07-19 DIAGNOSIS — F17200 Nicotine dependence, unspecified, uncomplicated: Secondary | ICD-10-CM | POA: Diagnosis not present

## 2016-07-19 DIAGNOSIS — E876 Hypokalemia: Secondary | ICD-10-CM

## 2016-07-19 DIAGNOSIS — N920 Excessive and frequent menstruation with regular cycle: Secondary | ICD-10-CM | POA: Diagnosis not present

## 2016-07-19 DIAGNOSIS — D259 Leiomyoma of uterus, unspecified: Secondary | ICD-10-CM | POA: Diagnosis not present

## 2016-07-19 DIAGNOSIS — R072 Precordial pain: Secondary | ICD-10-CM

## 2016-07-19 DIAGNOSIS — R1013 Epigastric pain: Secondary | ICD-10-CM | POA: Diagnosis not present

## 2016-07-19 DIAGNOSIS — D500 Iron deficiency anemia secondary to blood loss (chronic): Secondary | ICD-10-CM | POA: Diagnosis not present

## 2016-07-19 LAB — COMPREHENSIVE METABOLIC PANEL
ALBUMIN: 3.6 g/dL (ref 3.5–5.0)
ALT: 11 U/L — ABNORMAL LOW (ref 14–54)
ANION GAP: 6 (ref 5–15)
AST: 13 U/L — ABNORMAL LOW (ref 15–41)
Alkaline Phosphatase: 34 U/L — ABNORMAL LOW (ref 38–126)
BILIRUBIN TOTAL: 0.5 mg/dL (ref 0.3–1.2)
BUN: 9 mg/dL (ref 6–20)
CO2: 26 mmol/L (ref 22–32)
Calcium: 9 mg/dL (ref 8.9–10.3)
Chloride: 108 mmol/L (ref 101–111)
Creatinine, Ser: 0.78 mg/dL (ref 0.44–1.00)
GFR calc Af Amer: >60 mL/min (ref >60)
GFR calc non Af Amer: >60 mL/min (ref >60)
GLUCOSE: 90 mg/dL (ref 65–99)
POTASSIUM: 3.9 mmol/L (ref 3.5–5.1)
SODIUM: 140 mmol/L (ref 135–145)
Total Protein: 6.2 g/dL — ABNORMAL LOW (ref 6.5–8.1)

## 2016-07-19 LAB — CBC
HEMATOCRIT: 26 % — AB (ref 36.0–46.0)
Hemoglobin: 7.5 g/dL — ABNORMAL LOW (ref 12.0–15.0)
MCH: 19.2 pg — ABNORMAL LOW (ref 26.0–34.0)
MCHC: 28.8 g/dL — ABNORMAL LOW (ref 30.0–36.0)
MCV: 66.5 fL — ABNORMAL LOW (ref 78.0–100.0)
PLATELETS: 493 10*3/uL — AB (ref 150–400)
RBC: 3.91 MIL/uL (ref 3.87–5.11)
RDW: 23.7 % — ABNORMAL HIGH (ref 11.5–15.5)
WBC: 5.7 10*3/uL (ref 4.0–10.5)

## 2016-07-19 LAB — HEMOGLOBIN AND HEMATOCRIT, BLOOD
HCT: 28.8 % — ABNORMAL LOW (ref 36.0–46.0)
HEMOGLOBIN: 8.6 g/dL — AB (ref 12.0–15.0)

## 2016-07-19 LAB — PROTIME-INR
INR: 1.1
Prothrombin Time: 14.2 seconds (ref 11.4–15.2)

## 2016-07-19 LAB — TSH: TSH: 2.085 u[IU]/mL (ref 0.350–4.500)

## 2016-07-19 LAB — LIPID PANEL
CHOLESTEROL: 139 mg/dL (ref 0–200)
HDL: 41 mg/dL (ref >40)
LDL Cholesterol: 80 mg/dL (ref 0–99)
TRIGLYCERIDES: 89 mg/dL (ref <150)
Total CHOL/HDL Ratio: 3.4 RATIO
VLDL: 18 mg/dL (ref 0–40)

## 2016-07-19 LAB — PREPARE RBC (CROSSMATCH)

## 2016-07-19 LAB — LIPASE, BLOOD: LIPASE: 27 U/L (ref 11–51)

## 2016-07-19 LAB — TROPONIN I: Troponin I: <0.03 ng/mL (ref <0.03)

## 2016-07-19 LAB — HIV ANTIBODY (ROUTINE TESTING W REFLEX): HIV Screen 4th Generation wRfx: NONREACTIVE

## 2016-07-19 LAB — MAGNESIUM
MAGNESIUM: 2.2 mg/dL (ref 1.7–2.4)
Magnesium: 2.2 mg/dL (ref 1.7–2.4)

## 2016-07-19 LAB — PHOSPHORUS: PHOSPHORUS: 4.2 mg/dL (ref 2.5–4.6)

## 2016-07-19 MED ORDER — SODIUM CHLORIDE 0.9% FLUSH: 3.0000 mL | INTRAVENOUS | Status: DC | PRN

## 2016-07-19 MED ORDER — SODIUM CHLORIDE 0.9% FLUSH: 3.0000 mL | Freq: Two times a day (BID) | INTRAVENOUS | Status: DC

## 2016-07-19 MED ORDER — PANTOPRAZOLE SODIUM 40 MG PO TBEC: 40.0000 mg | DELAYED_RELEASE_TABLET | Freq: Every day | ORAL | 4 refills | Status: DC

## 2016-07-19 MED ORDER — SODIUM CHLORIDE 0.9 % IV SOLN: 250.0000 mL | INTRAVENOUS | Status: DC | PRN

## 2016-07-19 MED ORDER — SENNOSIDES-DOCUSATE SODIUM 8.6-50 MG PO TABS: 1.0000 | ORAL_TABLET | Freq: Every day | ORAL | 4 refills | Status: DC

## 2016-07-19 MED ORDER — SODIUM CHLORIDE 0.9% FLUSH
3.0000 mL | Freq: Two times a day (BID) | INTRAVENOUS | Status: DC
  Administered 2016-07-19: 3 mL via INTRAVENOUS

## 2016-07-19 MED ORDER — ACETAMINOPHEN 325 MG PO TABS: 650.0000 mg | ORAL_TABLET | Freq: Four times a day (QID) | ORAL | Status: DC | PRN

## 2016-07-19 MED ORDER — ONDANSETRON 8 MG PO TBDP: 8.0000 mg | ORAL_TABLET | Freq: Three times a day (TID) | ORAL | 0 refills | Status: DC | PRN

## 2016-07-19 MED ORDER — POTASSIUM CHLORIDE CRYS ER 20 MEQ PO TBCR
40.0000 meq | EXTENDED_RELEASE_TABLET | Freq: Once | ORAL | Status: AC
  Administered 2016-07-19: 40 meq via ORAL
  Filled 2016-07-19: qty 2

## 2016-07-19 MED ORDER — ONDANSETRON HCL 4 MG PO TABS: 4.0000 mg | ORAL_TABLET | Freq: Four times a day (QID) | ORAL | Status: DC | PRN

## 2016-07-19 MED ORDER — SENNOSIDES-DOCUSATE SODIUM 8.6-50 MG PO TABS: 1.0000 | ORAL_TABLET | Freq: Every day | ORAL | Status: DC

## 2016-07-19 MED ORDER — ONDANSETRON HCL 4 MG/2ML IJ SOLN: 4.0000 mg | Freq: Four times a day (QID) | INTRAMUSCULAR | Status: DC | PRN

## 2016-07-19 MED ORDER — DIPHENHYDRAMINE HCL 25 MG PO CAPS
25.0000 mg | ORAL_CAPSULE | Freq: Four times a day (QID) | ORAL | Status: DC | PRN
  Administered 2016-07-19: 25 mg via ORAL
  Filled 2016-07-19: qty 1

## 2016-07-19 MED ORDER — SODIUM CHLORIDE 0.9 % IV SOLN: Freq: Once | INTRAVENOUS | Status: DC

## 2016-07-19 MED ORDER — ACETAMINOPHEN 650 MG RE SUPP: 650.0000 mg | Freq: Four times a day (QID) | RECTAL | Status: DC | PRN

## 2016-07-19 MED ORDER — IRON DEXTRAN 50 MG/ML IJ SOLN
500.0000 mg | Freq: Once | INTRAMUSCULAR | Status: DC
  Filled 2016-07-19: qty 10

## 2016-07-19 MED ORDER — SODIUM CHLORIDE 0.9 % IV SOLN
25.0000 mg | Freq: Once | INTRAVENOUS | Status: DC
  Filled 2016-07-19 (×2): qty 0.5

## 2016-07-19 MED ORDER — POLYSACCHARIDE IRON COMPLEX 150 MG PO CAPS
150.0000 mg | ORAL_CAPSULE | Freq: Every day | ORAL | Status: DC
  Administered 2016-07-19: 150 mg via ORAL
  Filled 2016-07-19: qty 1

## 2016-07-19 MED ORDER — POLYSACCHARIDE IRON COMPLEX 150 MG PO CAPS: 150.0000 mg | ORAL_CAPSULE | Freq: Every day | ORAL | 4 refills | Status: DC

## 2016-07-19 NOTE — Progress Notes (Signed)
Pt is alert and oriented  Upon report. hgb 7.5 and lab drawn

## 2016-07-19 NOTE — Discharge Summary (Signed)
Physician Discharge Summary   Patient ID: Desiree Nunez MRN: [id number] DOB/AGE: 11-Oct-1970 46 y.o.  Admit date: 07/18/2016 Discharge date: 07/19/2016  Primary Care Physician:  [facility]  Discharge Diagnoses:    . Symptomatic anemia . Fibroids . Chest pain . Tobacco abuse . Hypokalemia . Epigastric pain secondary to GERD   Consults:  None  Recommendations for Outpatient Follow-up:  1. Please repeat CBC/BMET at next visit   DIET: Regular diet    Allergies:   Allergies  Allergen Reactions  . Strawberry Flavor Swelling     DISCHARGE MEDICATIONS: Current Discharge Medication List    START taking these medications   Details  iron polysaccharides (NIFEREX) 150 MG capsule Take 1 capsule (150 mg total) by mouth daily. Qty: 30 capsule, Refills: 4    pantoprazole (PROTONIX) 40 MG tablet Take 1 tablet (40 mg total) by mouth daily. Qty: 30 tablet, Refills: 4    senna-docusate (SENOKOT-S) 8.6-50 MG tablet Take 1 tablet by mouth at bedtime. Qty: 30 tablet, Refills: 4      CONTINUE these medications which have CHANGED   Details  ondansetron (ZOFRAN-ODT) 8 MG disintegrating tablet Take 1 tablet (8 mg total) by mouth every 8 (eight) hours as needed for nausea or vomiting. Qty: 20 tablet, Refills: 0      STOP taking these medications     naproxen sodium (ANAPROX) 220 MG tablet          Brief H and P: For complete details please refer to admission H and P, but in brief Desiree Nunez is a 46 y.o. female with medical history significant of Fibroids metrorrhagia Presented with progressive shortness of breath and occasional chest pain, Diffuse she felt it was muscular, She had some dull discomfort in her left arm. She also describes epigastric pain that has been persistent for days worse with eating improved after Zofran, associated with nausea. Patient has history of peptic ulcer disease years ago. Recently he has been taking NSAIDs. Denied any   Melena, stated today her stool has been white. Endorsed losing weight without trying. Patient went to her PCP and showed low hemoglobin and was recommended to go to the ED. She has heavy periods lasting 7 days. Goes through 10 pads.  Denies any rectal bleeding. Report PICA has been eating ice.  Reports dyspnea with exertion.  In  the past has been told that she has fibroids  Hospital Course:     Symptomatic anemia secondary to menorrhagia - Hemoglobin 6.3 at the time of admission, patient was transfused 2 units packed RBCs - Anemia panel showed iron 20, ferritin of 2, percent saturation 4, TIBC 477, patient was given IV iron and started on oral iron supplementation with stool softener - Pelvic and transvaginal ultrasound showed enlarged leiomyomatous uterus ; leiomyomas have increased in size from prior imaging. Lower uterine segment presumed intramural, less likely submucosal leiomyoma.  - Patient is interested in hysterectomy, recommended to see OB/GYN outpatient    Chest pain: Atypical secondary to symptomatic anemia - Ruled out for acute ACS, no acute ST-T wave changes suggestive of ischemia    Tobacco abuse -Patient counseled on smoking cessation    Hypokalemia Replaced    Epigastric painSecondary to GERD - Patient recommended to avoid NSAIDs, was placed on Protonix   Day of Discharge BP (!) 94/58   Pulse 60   Temp 98.2 F (36.8 C) (Oral)   Resp 16   Ht 5\' 9"  (1.753 m)   Wt 89.5 kg (  197 lb 6.4 oz)   LMP 07/01/2016 (Within Days)   SpO2 100%   BMI 29.15 kg/m   Physical Exam: General: Alert and awake oriented x3 not in any acute distress. HEENT: anicteric sclera, pupils reactive to light and accommodation CVS: S1-S2 clear no murmur rubs or gallops Chest: clear to auscultation bilaterally, no wheezing rales or rhonchi Abdomen: soft nontender, nondistended, normal bowel sounds Extremities: no cyanosis, clubbing or edema noted bilaterally Neuro: Cranial nerves II-XII  intact, no focal neurological deficits   The results of significant diagnostics from this hospitalization (including imaging, microbiology, ancillary and laboratory) are listed below for reference.    LAB RESULTS: Basic Metabolic Panel:  Recent Labs Lab 07/18/16 1439  07/19/16 0718  NA 138  --  140  K 3.2*  --  3.9  CL 105  --  108  CO2 26  --  26  GLUCOSE 117*  --  90  BUN 5*  --  9  CREATININE 0.89  --  0.78  CALCIUM 9.2  --  9.0  MG  --   < > 2.2  PHOS  --   --  4.2  < > = values in this interval not displayed. Liver Function Tests:  Recent Labs Lab 07/18/16 1439 07/19/16 0718  AST 13* 13*  ALT 11* 11*  ALKPHOS 37* 34*  BILITOT 0.3 0.5  PROT 6.7 6.2*  ALBUMIN 4.1 3.6    Recent Labs Lab 07/19/16 0718  LIPASE 27   No results for input(s): AMMONIA in the last 168 hours. CBC:  Recent Labs Lab 07/18/16 2109 07/19/16 0446  WBC 6.3 5.7  HGB 6.5* 7.5*  HCT 23.3* 26.0*  MCV 63.8* 66.5*  PLT 544* 493*   Cardiac Enzymes:  Recent Labs Lab 07/18/16 2109 07/19/16 0718  TROPONINI <0.03 <0.03   BNP: Invalid input(s): POCBNP CBG: No results for input(s): GLUCAP in the last 168 hours.  Significant Diagnostic Studies:  US Transvaginal Non-ob  Result Date: 07/19/2016 CLINICAL DATA:  Anemia and menorrhagia.  Follow-up fibroids. EXAM: TRANSABDOMINAL AND TRANSVAGINAL ULTRASOUND OF PELVIS TECHNIQUE: Both transabdominal and transvaginal ultrasound examinations of the pelvis were performed. Transabdominal technique was performed for global imaging of the pelvis including uterus, ovaries, adnexal regions, and pelvic cul-de-sac. It was necessary to proceed with endovaginal exam following the transabdominal exam to visualize the adnexa and endometrium. COMPARISON:  Pelvic ultrasound of July 08, 2008 FINDINGS: Uterus Measurements: 18.1 x 8.6 x 9.8 cm. Multiple isoechoic to hypoechoic leiomyomata, largest measuring 4.1 x 3.8 x 3.9 cm within the lower uterine segment  deforming the endometrial wall. At least 3 additional discrete leiomyomata identified. The small submucosal leiomyoma on prior examination is not discretely identified. Endometrium Thickness: 12 mm.  No focal abnormality visualized. Right ovary Measurements: 2.9 x 2.1 x 2.6 cm. Normal appearance/no adnexal mass. Left ovary Measurements: 3.1 x 1.8 x 2.7 cm. Normal appearance/no adnexal mass. Other findings No abnormal free fluid. IMPRESSION: Enlarged leiomyomatous uterus ; leiomyomas have increased in size from prior imaging. Lower uterine segment presumed intramural, less likely submucosal leiomyoma. Electronically Signed   By: Elon Alas M.D.   On: 07/19/2016 00:44   US Pelvis Complete  Result Date: 07/19/2016 CLINICAL DATA:  Anemia and menorrhagia.  Follow-up fibroids. EXAM: TRANSABDOMINAL AND TRANSVAGINAL ULTRASOUND OF PELVIS TECHNIQUE: Both transabdominal and transvaginal ultrasound examinations of the pelvis were performed. Transabdominal technique was performed for global imaging of the pelvis including uterus, ovaries, adnexal regions, and pelvic cul-de-sac. It was necessary to proceed with  endovaginal exam following the transabdominal exam to visualize the adnexa and endometrium. COMPARISON:  Pelvic ultrasound of July 08, 2008 FINDINGS: Uterus Measurements: 18.1 x 8.6 x 9.8 cm. Multiple isoechoic to hypoechoic leiomyomata, largest measuring 4.1 x 3.8 x 3.9 cm within the lower uterine segment deforming the endometrial wall. At least 3 additional discrete leiomyomata identified. The small submucosal leiomyoma on prior examination is not discretely identified. Endometrium Thickness: 12 mm.  No focal abnormality visualized. Right ovary Measurements: 2.9 x 2.1 x 2.6 cm. Normal appearance/no adnexal mass. Left ovary Measurements: 3.1 x 1.8 x 2.7 cm. Normal appearance/no adnexal mass. Other findings No abnormal free fluid. IMPRESSION: Enlarged leiomyomatous uterus ; leiomyomas have increased in size from  prior imaging. Lower uterine segment presumed intramural, less likely submucosal leiomyoma. Electronically Signed   By: Elon Alas M.D.   On: 07/19/2016 00:44   US Abdomen Limited Ruq  Result Date: 07/19/2016 CLINICAL DATA:  Right upper quadrant pain for 1 week EXAM: US ABDOMEN LIMITED - RIGHT UPPER QUADRANT COMPARISON:  07/26/2010 FINDINGS: Gallbladder: Gallbladder is mildly contracted, likely due to nonfasting state. No stones or sludge. No wall thickening or edema. Murphy's sign is negative. Common bile duct: Diameter: 2.5 mm, normal Liver: No focal lesion identified. Within normal limits in parenchymal echogenicity. IMPRESSION: Contracted gallbladder is likely physiologic.  Normal examination. Electronically Signed   By: Lucienne Capers M.D.   On: 07/19/2016 00:14    2D ECHO:   Disposition and Follow-up: Discharge Instructions    Diet general    Complete by:  As directed    Increase activity slowly    Complete by:  As directed        DISPOSITION: home    [place]., MD. Daphane Shepherd on 07/31/2016.   Specialty:  Obstetrics and Gynecology Why:  @9 :00am Contact information: [place] [employer] [employer] [place] [address] [phone]            Time spent on Discharge: 58mins   Signed:   RAI,RIPUDEEP M.D. [facility] 07/19/2016, 6:40 PM Pager: [phone]

## 2016-07-19 NOTE — Progress Notes (Deleted)
Rounding with MD states that the plan is for discharge to check 02 sats walking

## 2016-07-19 NOTE — Progress Notes (Signed)
Pt is alert and oriented with no complaints, 3rd unit of blood given to patient with no s/s ofreaction noted.

## 2016-07-19 NOTE — Progress Notes (Signed)
Went in for change of shift report and the day shift nurse was finishing up discharge. Patient on her way off of the unit.

## 2016-07-19 NOTE — Progress Notes (Deleted)
SATURATION QUALIFICATIONS: (This note is used to comply with regulatory documentation for home oxygen)  Patient Saturations on Room Air at Rest = 95%  Patient Saturations on Room Air while Ambulating = 98%  Patient Saturations on  Liters of oxygen while Ambulating = %  Please briefly explain why patient needs home oxygen: no 02 needed

## 2016-07-19 NOTE — Progress Notes (Signed)
Patient arrived to unit with a unit of blood running, ambulated to bed in room, no complaints of pain. CCMD called after tele box was hooked up, patient oriented to unit, call bell with in reach.

## 2016-07-19 NOTE — Progress Notes (Signed)
Responded to [facility] Consult to assist patient with completing AD. Document was completed and original given to patient, along with two copies for chosen agents and one to patient nurse for chart.  Patient was very cheerful and scheduled for D/C today.   07/19/16 1000  Clinical Encounter Type  Visited With Patient;Health care provider  Visit Type Initial;Spiritual support  Consult/Referral To ([place] Consult)  Spiritual Encounters  Spiritual Needs Literature;Emotional  Stress Factors  Patient Stress Factors None identified  Jaclynn Major, [place]

## 2016-07-19 NOTE — Progress Notes (Signed)
Paged MD to Clarify order to transfuse Iron.

## 2016-07-20 LAB — TYPE AND SCREEN
ABO/RH(D): A POS
ANTIBODY SCREEN: NEGATIVE
UNIT DIVISION: 0
UNIT DIVISION: 0
UNIT DIVISION: 0

## 2016-07-20 LAB — BPAM RBC
BLOOD PRODUCT EXPIRATION DATE: 201803162359
BLOOD PRODUCT EXPIRATION DATE: 201804062359
Blood Product Expiration Date: 201803212359
ISSUE DATE / TIME: 201803142233
ISSUE DATE / TIME: 201803141626
ISSUE DATE / TIME: 201803151201
UNIT TYPE AND RH: 6200
Unit Type and Rh: 6200
Unit Type and Rh: 9500

## 2016-07-20 LAB — HEMOGLOBIN A1C
HEMOGLOBIN A1C: 5.4 % (ref 4.8–5.6)
Mean Plasma Glucose: 108 mg/dL

## 2016-07-30 ENCOUNTER — Other Ambulatory Visit: Payer: Self-pay | Admitting: Obstetrics and Gynecology

## 2016-07-30 DIAGNOSIS — D250 Submucous leiomyoma of uterus: Secondary | ICD-10-CM

## 2016-07-31 ENCOUNTER — Other Ambulatory Visit: Payer: Self-pay | Admitting: Obstetrics and Gynecology

## 2016-07-31 ENCOUNTER — Ambulatory Visit
Admission: RE | Admit: 2016-07-31 | Discharge: 2016-07-31 | Disposition: A | Discharge status: Home / Self Care | Source: Ambulatory Visit | Attending: Obstetrics and Gynecology | Admitting: Obstetrics and Gynecology

## 2016-07-31 DIAGNOSIS — D250 Submucous leiomyoma of uterus: Secondary | ICD-10-CM

## 2016-07-31 HISTORY — PX: IR RADIOLOGIST EVAL & MGMT: IMG5224

## 2016-07-31 NOTE — Consult Note (Signed)
Chief Complaint: Patient was seen in consultation today for  Chief Complaint  Patient presents with  . Advice Only    Consult for [place]     at the request of [place]  Referring Physician(s): McComb,John  History of Present Illness: Desiree Nunez is a 46 y.o. female referred for consultation regarding uterine fibroid embolization due to severe menorrhagia.  Patient has been diagnosed with uterine fibroids since before 2015. She recently was hospitalized for shortness of breath found to be related to severe anemia requiring RBC transfusion and are infusion. Ultrasound demonstrated multiple fibroids. Uterus is 12-14 weeks in size. She describes primarily menorrhagia with heavy menstrual cycles are every 28 days lasting 6-7 days, with 4 days of heavy bleeding including clots requiring changing pads every 20 minutes. She denies any interperiod bleeding. She's tried birth control pills for fibroid control. No previous fibroid surgeries. No previous gynecologic infections. She is G1 P0 with no definite plans for future pregnancy although she is open to the concept. She has 3 foster children currently. No perimenopausal symptoms.  Past Medical History:  Diagnosis Date  . Uterine fibroid     No past surgical history on file.  Allergies: Strawberry flavor  Medications: Prior to Admission medications   Medication Sig Start Date End Date Taking? Authorizing Provider  iron polysaccharides (NIFEREX) 150 MG capsule Take 1 capsule (150 mg total) by mouth daily. 07/20/16  Yes Ripudeep Krystal Eaton, MD  ondansetron (ZOFRAN-ODT) 8 MG disintegrating tablet Take 1 tablet (8 mg total) by mouth every 8 (eight) hours as needed for nausea or vomiting. 07/19/16  Yes Ripudeep K Rai, MD  pantoprazole (PROTONIX) 40 MG tablet Take 1 tablet (40 mg total) by mouth daily. 07/19/16  Yes Ripudeep Krystal Eaton, MD  senna-docusate (SENOKOT-S) 8.6-50 MG tablet Take 1 tablet by mouth at bedtime. 07/19/16   Ripudeep Krystal Eaton, MD     Family History  Problem Relation Age of Onset  . CAD Father   . Hypertension Sister   . Diabetes Other   . Cancer Neg Hx     Social History   Social History  . Marital status: Married    Spouse name: N/A  . Number of children: N/A  . Years of education: N/A   Social History Main Topics  . Smoking status: Current Every Day Smoker    Packs/day: 0.50  . Smokeless tobacco: Never Used  . Alcohol use Yes     Comment: rare  . Drug use: Unknown  . Sexual activity: Not on file   Other Topics Concern  . Not on file   Social History Narrative  . No narrative on file    ECOG Status: 1 - Symptomatic but completely ambulatory  Review of Systems: A 12 point ROS discussed and pertinent positives are indicated in the HPI above.  All other systems are negative.  Review of Systems  Vital Signs: BP 133/69 (BP Location: Right Arm, Patient Position: Sitting, Cuff Size: Large)   Pulse (!) 59   Temp 98.7 F (37.1 C) (Oral)   Resp 14   Ht 5\' 9"  (1.753 m)   Wt 196 lb (88.9 kg)   LMP 07/01/2016 (Within Days)   SpO2 100%   BMI 28.94 kg/m   Physical Exam  Mallampati Score:     Imaging: US Transvaginal Non-ob  Result Date: 07/19/2016 CLINICAL DATA:  Anemia and menorrhagia.  Follow-up fibroids. EXAM: TRANSABDOMINAL AND TRANSVAGINAL ULTRASOUND OF PELVIS TECHNIQUE: Both transabdominal and transvaginal ultrasound examinations of the  pelvis were performed. Transabdominal technique was performed for global imaging of the pelvis including uterus, ovaries, adnexal regions, and pelvic cul-de-sac. It was necessary to proceed with endovaginal exam following the transabdominal exam to visualize the adnexa and endometrium. COMPARISON:  Pelvic ultrasound of July 08, 2008 FINDINGS: Uterus Measurements: 18.1 x 8.6 x 9.8 cm. Multiple isoechoic to hypoechoic leiomyomata, largest measuring 4.1 x 3.8 x 3.9 cm within the lower uterine segment deforming the endometrial wall. At least 3 additional  discrete leiomyomata identified. The small submucosal leiomyoma on prior examination is not discretely identified. Endometrium Thickness: 12 mm.  No focal abnormality visualized. Right ovary Measurements: 2.9 x 2.1 x 2.6 cm. Normal appearance/no adnexal mass. Left ovary Measurements: 3.1 x 1.8 x 2.7 cm. Normal appearance/no adnexal mass. Other findings No abnormal free fluid. IMPRESSION: Enlarged leiomyomatous uterus ; leiomyomas have increased in size from prior imaging. Lower uterine segment presumed intramural, less likely submucosal leiomyoma. Electronically Signed   By: Elon Alas M.D.   On: 07/19/2016 00:44   US Pelvis Complete  Result Date: 07/19/2016 CLINICAL DATA:  Anemia and menorrhagia.  Follow-up fibroids. EXAM: TRANSABDOMINAL AND TRANSVAGINAL ULTRASOUND OF PELVIS TECHNIQUE: Both transabdominal and transvaginal ultrasound examinations of the pelvis were performed. Transabdominal technique was performed for global imaging of the pelvis including uterus, ovaries, adnexal regions, and pelvic cul-de-sac. It was necessary to proceed with endovaginal exam following the transabdominal exam to visualize the adnexa and endometrium. COMPARISON:  Pelvic ultrasound of July 08, 2008 FINDINGS: Uterus Measurements: 18.1 x 8.6 x 9.8 cm. Multiple isoechoic to hypoechoic leiomyomata, largest measuring 4.1 x 3.8 x 3.9 cm within the lower uterine segment deforming the endometrial wall. At least 3 additional discrete leiomyomata identified. The small submucosal leiomyoma on prior examination is not discretely identified. Endometrium Thickness: 12 mm.  No focal abnormality visualized. Right ovary Measurements: 2.9 x 2.1 x 2.6 cm. Normal appearance/no adnexal mass. Left ovary Measurements: 3.1 x 1.8 x 2.7 cm. Normal appearance/no adnexal mass. Other findings No abnormal free fluid. IMPRESSION: Enlarged leiomyomatous uterus ; leiomyomas have increased in size from prior imaging. Lower uterine segment presumed  intramural, less likely submucosal leiomyoma. Electronically Signed   By: Elon Alas M.D.   On: 07/19/2016 00:44   US Abdomen Limited Ruq  Result Date: 07/19/2016 CLINICAL DATA:  Right upper quadrant pain for 1 week EXAM: US ABDOMEN LIMITED - RIGHT UPPER QUADRANT COMPARISON:  07/26/2010 FINDINGS: Gallbladder: Gallbladder is mildly contracted, likely due to nonfasting state. No stones or sludge. No wall thickening or edema. Murphy's sign is negative. Common bile duct: Diameter: 2.5 mm, normal Liver: No focal lesion identified. Within normal limits in parenchymal echogenicity. IMPRESSION: Contracted gallbladder is likely physiologic.  Normal examination. Electronically Signed   By: Lucienne Capers M.D.   On: 07/19/2016 00:14    Labs:  CBC:  Recent Labs  07/18/16 1439 07/18/16 2109 07/19/16 0446 07/19/16 1824  WBC 5.2 6.3 5.7  --   HGB 6.3* 6.5* 7.5* 8.6*  HCT 23.8* 23.3* 26.0* 28.8*  PLT 658* 544* 493*  --     COAGS:  Recent Labs  07/19/16 0718  INR 1.10    BMP:  Recent Labs  07/18/16 1439 07/19/16 0718  NA 138 140  K 3.2* 3.9  CL 105 108  CO2 26 26  GLUCOSE 117* 90  BUN 5* 9  CALCIUM 9.2 9.0  CREATININE 0.89 0.78  GFRNONAA >60 >60  GFRAA >60 >60    LIVER FUNCTION TESTS:  Recent Labs  07/18/16  1439 07/19/16 0718  BILITOT 0.3 0.5  AST 13* 13*  ALT 11* 11*  ALKPHOS 37* 34*  PROT 6.7 6.2*  ALBUMIN 4.1 3.6    TUMOR MARKERS: No results for input(s): AFPTM, CEA, CA199, CHROMGRNA in the last 8760 hours.  Assessment and Plan:  My impression is that this patient's menorrhagia is  likely secondary to uterine fibroids. We spent the majority of the consultation discussing the pathophysiology of uterine leiomyomata, natural history, anticipated  involution post menopause, and treatment options. We discussed myomectomy, hysterectomy, and uterine fibroid embolization. I described the technique of UFE, anticipated benefits, possible risks and complications  including but not limited to bleeding, infection, vessel damage, nontarget embolization, and incomplete symptom relief. We discussed the 90% clinical success rate historically and at our experience with UFE. We discussed the post procedure course and time course of symptom resolution.We discussed that UFE is not a fertility treatment. We discussed the need for continued gynecologic care.  She seemed to understand and did ask appropriate questions, which were answered.  Based on her evaluation thus far, I think she would be an appropriate candidate for uterine fibroid embolization because of her symptomatology and  uterine fibroids. To complete her evaluation and workup, I would require pelvic MRI with contrast to best determine the exact site of location of her uterine fibroids, specifically to exclude any pedunculated fibroids on a narrow stalk, as well as to exclude any unexpected pelvic pathology.   After our discussion, the patient was motivated proceed. Accordingly, we can set up the MRI   at her convenience as an outpatient. If this looks okay, she would like to proceed with UFE.  Thank you for this interesting consult.  I greatly enjoyed meeting Jocie Meroney and look forward to participating in their care.  A copy of this report was sent to the requesting provider on this date.  Electronically Signed: Jasma Seevers III, DAYNE Kamaree Wheatley 07/31/2016, 2:15 PM   I spent a total of  40 Minutes   in face to face in clinical consultation, greater than 50% of which was counseling/coordinating care for symptomatic uterine fibroids.

## 2016-08-01 ENCOUNTER — Ambulatory Visit
Admission: RE | Admit: 2016-08-01 | Discharge: 2016-08-01 | Disposition: A | Discharge status: Home / Self Care | Source: Ambulatory Visit | Attending: Obstetrics and Gynecology | Admitting: Obstetrics and Gynecology

## 2016-08-01 DIAGNOSIS — D250 Submucous leiomyoma of uterus: Secondary | ICD-10-CM

## 2016-08-01 MED ORDER — GADOBENATE DIMEGLUMINE 529 MG/ML IV SOLN
18.0000 mL | Freq: Once | INTRAVENOUS | Status: AC | PRN
  Administered 2016-08-01: 18 mL via INTRAVENOUS

## 2016-08-02 ENCOUNTER — Ambulatory Visit
Admission: RE | Admit: 2016-08-02 | Discharge: 2016-08-02 | Disposition: A | Discharge status: Home / Self Care | Source: Ambulatory Visit | Attending: Obstetrics and Gynecology | Admitting: Obstetrics and Gynecology

## 2016-08-02 ENCOUNTER — Other Ambulatory Visit: Payer: Self-pay | Admitting: Obstetrics and Gynecology

## 2016-08-02 DIAGNOSIS — D250 Submucous leiomyoma of uterus: Secondary | ICD-10-CM

## 2016-08-03 ENCOUNTER — Other Ambulatory Visit: Payer: Self-pay | Admitting: Interventional Radiology

## 2016-08-03 DIAGNOSIS — D250 Submucous leiomyoma of uterus: Secondary | ICD-10-CM

## 2016-08-03 DIAGNOSIS — D252 Subserosal leiomyoma of uterus: Principal | ICD-10-CM

## 2016-08-03 DIAGNOSIS — D251 Intramural leiomyoma of uterus: Principal | ICD-10-CM

## 2016-09-07 ENCOUNTER — Other Ambulatory Visit: Payer: Self-pay | Admitting: General Surgery

## 2016-09-10 ENCOUNTER — Encounter (HOSPITAL_COMMUNITY): Payer: Self-pay

## 2016-09-10 ENCOUNTER — Observation Stay (HOSPITAL_COMMUNITY)
Admission: RE | Admit: 2016-09-10 | Discharge: 2016-09-11 | Disposition: A | Discharge status: Home / Self Care | Source: Ambulatory Visit | Attending: Interventional Radiology | Admitting: Interventional Radiology

## 2016-09-10 ENCOUNTER — Ambulatory Visit (HOSPITAL_COMMUNITY)
Admission: RE | Admit: 2016-09-10 | Discharge: 2016-09-10 | Disposition: A | Discharge status: Home / Self Care | Source: Ambulatory Visit | Attending: Interventional Radiology | Admitting: Interventional Radiology

## 2016-09-10 DIAGNOSIS — D251 Intramural leiomyoma of uterus: Secondary | ICD-10-CM | POA: Diagnosis not present

## 2016-09-10 DIAGNOSIS — D649 Anemia, unspecified: Secondary | ICD-10-CM | POA: Diagnosis not present

## 2016-09-10 DIAGNOSIS — K219 Gastro-esophageal reflux disease without esophagitis: Secondary | ICD-10-CM | POA: Insufficient documentation

## 2016-09-10 DIAGNOSIS — N83209 Unspecified ovarian cyst, unspecified side: Secondary | ICD-10-CM | POA: Diagnosis not present

## 2016-09-10 DIAGNOSIS — N920 Excessive and frequent menstruation with regular cycle: Secondary | ICD-10-CM | POA: Diagnosis not present

## 2016-09-10 DIAGNOSIS — D252 Subserosal leiomyoma of uterus: Secondary | ICD-10-CM

## 2016-09-10 DIAGNOSIS — D250 Submucous leiomyoma of uterus: Secondary | ICD-10-CM

## 2016-09-10 HISTORY — PX: IR ANGIOGRAM PELVIS SELECTIVE OR SUPRASELECTIVE: IMG661

## 2016-09-10 HISTORY — PX: IR ANGIOGRAM SELECTIVE EACH ADDITIONAL VESSEL: IMG667

## 2016-09-10 HISTORY — PX: IR EMBO TUMOR ORGAN ISCHEMIA INFARCT INC GUIDE ROADMAPPING: IMG5449

## 2016-09-10 HISTORY — DX: Unspecified ovarian cyst, unspecified side: N83.209

## 2016-09-10 HISTORY — DX: Anemia, unspecified: D64.9

## 2016-09-10 HISTORY — DX: Gastro-esophageal reflux disease without esophagitis: K21.9

## 2016-09-10 HISTORY — PX: IR US GUIDE VASC ACCESS RIGHT: IMG2390

## 2016-09-10 LAB — BASIC METABOLIC PANEL
Anion gap: 9 (ref 5–15)
BUN: 10 mg/dL (ref 6–20)
CALCIUM: 9.3 mg/dL (ref 8.9–10.3)
CO2: 22 mmol/L (ref 22–32)
CREATININE: 0.75 mg/dL (ref 0.44–1.00)
Chloride: 109 mmol/L (ref 101–111)
GFR calc Af Amer: >60 mL/min (ref >60)
GFR calc non Af Amer: >60 mL/min (ref >60)
GLUCOSE: 101 mg/dL — AB (ref 65–99)
Potassium: 3.6 mmol/L (ref 3.5–5.1)
Sodium: 140 mmol/L (ref 135–145)

## 2016-09-10 LAB — CBC WITH DIFFERENTIAL/PLATELET
BASOS ABS: 0 10*3/uL (ref 0.0–0.1)
Basophils Relative: 0 %
Eosinophils Absolute: 0.3 10*3/uL (ref 0.0–0.7)
Eosinophils Relative: 5 %
HEMATOCRIT: 29 % — AB (ref 36.0–46.0)
HEMOGLOBIN: 8.7 g/dL — AB (ref 12.0–15.0)
LYMPHS PCT: 34 %
Lymphs Abs: 2.1 10*3/uL (ref 0.7–4.0)
MCH: 21.7 pg — ABNORMAL LOW (ref 26.0–34.0)
MCHC: 30 g/dL (ref 30.0–36.0)
MCV: 72.3 fL — ABNORMAL LOW (ref 78.0–100.0)
MONOS PCT: 11 %
Monocytes Absolute: 0.7 10*3/uL (ref 0.1–1.0)
NEUTROS ABS: 3.2 10*3/uL (ref 1.7–7.7)
Neutrophils Relative %: 50 %
Platelets: 583 10*3/uL — ABNORMAL HIGH (ref 150–400)
RBC: 4.01 MIL/uL (ref 3.87–5.11)
RDW: 24.8 % — ABNORMAL HIGH (ref 11.5–15.5)
WBC: 6.3 10*3/uL (ref 4.0–10.5)

## 2016-09-10 LAB — HCG, SERUM, QUALITATIVE: Preg, Serum: NEGATIVE

## 2016-09-10 LAB — PROTIME-INR
INR: 0.95
Prothrombin Time: 12.7 seconds (ref 11.4–15.2)

## 2016-09-10 LAB — APTT: aPTT: 34 seconds (ref 24–36)

## 2016-09-10 MED ORDER — DIPHENHYDRAMINE HCL 12.5 MG/5ML PO ELIX
12.5000 mg | ORAL_SOLUTION | Freq: Four times a day (QID) | ORAL | Status: DC | PRN
  Filled 2016-09-10: qty 5

## 2016-09-10 MED ORDER — PROMETHAZINE HCL 25 MG PO TABS
25.0000 mg | ORAL_TABLET | Freq: Three times a day (TID) | ORAL | Status: DC | PRN
  Administered 2016-09-10 – 2016-09-11 (×2): 25 mg via ORAL
  Filled 2016-09-10 (×2): qty 1

## 2016-09-10 MED ORDER — SODIUM CHLORIDE 0.9 % IV SOLN
INTRAVENOUS | Status: DC
  Administered 2016-09-10: 08:00:00 via INTRAVENOUS

## 2016-09-10 MED ORDER — ONDANSETRON HCL 4 MG/2ML IJ SOLN
INTRAMUSCULAR | Status: AC
  Filled 2016-09-10: qty 2

## 2016-09-10 MED ORDER — IOPAMIDOL (ISOVUE-300) INJECTION 61%
INTRAVENOUS | Status: AC
  Administered 2016-09-10: 60 mL via INTRA_ARTERIAL
  Filled 2016-09-10: qty 300

## 2016-09-10 MED ORDER — KETOROLAC TROMETHAMINE 30 MG/ML IJ SOLN
INTRAMUSCULAR | Status: AC
  Administered 2016-09-10: 30 mg via INTRAVENOUS
  Filled 2016-09-10: qty 1

## 2016-09-10 MED ORDER — ONDANSETRON HCL 4 MG/2ML IJ SOLN
4.0000 mg | Freq: Four times a day (QID) | INTRAMUSCULAR | Status: DC | PRN
  Administered 2016-09-10 (×2): 4 mg via INTRAVENOUS
  Filled 2016-09-10: qty 2

## 2016-09-10 MED ORDER — KETOROLAC TROMETHAMINE 30 MG/ML IJ SOLN
30.0000 mg | INTRAMUSCULAR | Status: AC
  Administered 2016-09-10: 30 mg via INTRAVENOUS

## 2016-09-10 MED ORDER — DIPHENHYDRAMINE HCL 50 MG/ML IJ SOLN: 12.5000 mg | Freq: Four times a day (QID) | INTRAMUSCULAR | Status: DC | PRN

## 2016-09-10 MED ORDER — MEDROXYPROGESTERONE ACETATE 10 MG PO TABS
10.0000 mg | ORAL_TABLET | Freq: Every day | ORAL | Status: DC
  Administered 2016-09-11: 10 mg via ORAL
  Filled 2016-09-10 (×2): qty 1

## 2016-09-10 MED ORDER — SODIUM CHLORIDE 0.9% FLUSH: 3.0000 mL | Freq: Two times a day (BID) | INTRAVENOUS | Status: DC

## 2016-09-10 MED ORDER — FENTANYL CITRATE (PF) 100 MCG/2ML IJ SOLN
INTRAMUSCULAR | Status: AC
  Filled 2016-09-10: qty 4

## 2016-09-10 MED ORDER — POLYSACCHARIDE IRON COMPLEX 150 MG PO CAPS
150.0000 mg | ORAL_CAPSULE | Freq: Every day | ORAL | Status: DC
  Administered 2016-09-11: 150 mg via ORAL
  Filled 2016-09-10 (×2): qty 1

## 2016-09-10 MED ORDER — FENTANYL CITRATE (PF) 100 MCG/2ML IJ SOLN
INTRAMUSCULAR | Status: AC | PRN
  Administered 2016-09-10: 25 ug via INTRAVENOUS
  Administered 2016-09-10: 50 ug via INTRAVENOUS
  Administered 2016-09-10: 25 ug via INTRAVENOUS

## 2016-09-10 MED ORDER — LIDOCAINE HCL 1 % IJ SOLN
INTRAMUSCULAR | Status: AC | PRN
  Administered 2016-09-10: 10 mL

## 2016-09-10 MED ORDER — HYDROMORPHONE 1 MG/ML IV SOLN
INTRAVENOUS | Status: AC
  Filled 2016-09-10: qty 25

## 2016-09-10 MED ORDER — CEFAZOLIN SODIUM-DEXTROSE 2-4 GM/100ML-% IV SOLN: 2.0000 g | INTRAVENOUS | Status: DC

## 2016-09-10 MED ORDER — SODIUM CHLORIDE 0.9% FLUSH: 3.0000 mL | INTRAVENOUS | Status: DC | PRN

## 2016-09-10 MED ORDER — PANTOPRAZOLE SODIUM 40 MG PO TBEC
40.0000 mg | DELAYED_RELEASE_TABLET | Freq: Every day | ORAL | Status: DC
  Administered 2016-09-11: 40 mg via ORAL
  Filled 2016-09-10: qty 1

## 2016-09-10 MED ORDER — DOCUSATE SODIUM 100 MG PO CAPS
100.0000 mg | ORAL_CAPSULE | Freq: Two times a day (BID) | ORAL | Status: DC
  Administered 2016-09-10 – 2016-09-11 (×2): 100 mg via ORAL
  Filled 2016-09-10 (×2): qty 1

## 2016-09-10 MED ORDER — HYDROCODONE-ACETAMINOPHEN 5-325 MG PO TABS
1.0000 | ORAL_TABLET | ORAL | Status: DC | PRN
  Administered 2016-09-11: 2 via ORAL
  Filled 2016-09-10 (×2): qty 2

## 2016-09-10 MED ORDER — LIDOCAINE HCL 1 % IJ SOLN
INTRAMUSCULAR | Status: AC
  Filled 2016-09-10: qty 20

## 2016-09-10 MED ORDER — IOPAMIDOL (ISOVUE-300) INJECTION 61%
100.0000 mL | Freq: Once | INTRAVENOUS | Status: AC | PRN
  Administered 2016-09-10: 20 mL via INTRAVENOUS

## 2016-09-10 MED ORDER — IOPAMIDOL (ISOVUE-300) INJECTION 61%
100.0000 mL | Freq: Once | INTRAVENOUS | Status: AC | PRN
  Administered 2016-09-10: 30 mL via INTRA_ARTERIAL

## 2016-09-10 MED ORDER — PROMETHAZINE HCL 25 MG RE SUPP: 25.0000 mg | Freq: Three times a day (TID) | RECTAL | Status: DC | PRN

## 2016-09-10 MED ORDER — HYDROMORPHONE 1 MG/ML IV SOLN
INTRAVENOUS | Status: DC
  Administered 2016-09-10: 10:00:00 via INTRAVENOUS

## 2016-09-10 MED ORDER — SODIUM CHLORIDE 0.9 % IV SOLN
8.0000 mg | INTRAVENOUS | Status: AC
  Administered 2016-09-10: 8 mg via INTRAVENOUS
  Filled 2016-09-10: qty 4

## 2016-09-10 MED ORDER — IBUPROFEN 800 MG PO TABS
800.0000 mg | ORAL_TABLET | Freq: Four times a day (QID) | ORAL | Status: DC
  Administered 2016-09-10 – 2016-09-11 (×2): 800 mg via ORAL
  Filled 2016-09-10 (×2): qty 1

## 2016-09-10 MED ORDER — IOPAMIDOL (ISOVUE-300) INJECTION 61%
100.0000 mL | Freq: Once | INTRAVENOUS | Status: AC | PRN
  Administered 2016-09-10: 60 mL via INTRA_ARTERIAL

## 2016-09-10 MED ORDER — SODIUM CHLORIDE 0.9% FLUSH: 9.0000 mL | INTRAVENOUS | Status: DC | PRN

## 2016-09-10 MED ORDER — SODIUM CHLORIDE 0.9 % IV SOLN: 250.0000 mL | INTRAVENOUS | Status: DC | PRN

## 2016-09-10 MED ORDER — MIDAZOLAM HCL 2 MG/2ML IJ SOLN
INTRAMUSCULAR | Status: AC | PRN
  Administered 2016-09-10 (×2): 1 mg via INTRAVENOUS
  Administered 2016-09-10 (×2): 0.5 mg via INTRAVENOUS
  Administered 2016-09-10: 1 mg via INTRAVENOUS

## 2016-09-10 MED ORDER — CEFAZOLIN SODIUM-DEXTROSE 2-4 GM/100ML-% IV SOLN
INTRAVENOUS | Status: AC
  Administered 2016-09-10: 2 g via INTRAVENOUS
  Filled 2016-09-10: qty 100

## 2016-09-10 MED ORDER — MIDAZOLAM HCL 2 MG/2ML IJ SOLN
INTRAMUSCULAR | Status: AC
  Filled 2016-09-10: qty 6

## 2016-09-10 MED ORDER — NALOXONE HCL 0.4 MG/ML IJ SOLN: 0.4000 mg | INTRAMUSCULAR | Status: DC | PRN

## 2016-09-10 NOTE — Progress Notes (Signed)
Patient ID: Desiree Nunez, female   DOB: 12-07-1970, 46 y.o.   MRN: [id number] Patient currently asleep; family in room; no acute issues per nurse; pelvic cramping currently controlled with Dilaudid PCA Afebrile, BP okay, heart rate bradycardic Puncture site right common femoral artery soft, clean, dry, nontender, no hematoma; intact distal pulses Assessment/plan: Status post bilateral uterine artery embolization for symptomatic uterine fibroids; for overnight observation; DC Foley later this evening; follow-up in IR clinic with Dr. Vernard Gambles in 2-4 weeks; follow-up pelvic MRI in 6 months  [facility]

## 2016-09-10 NOTE — Sedation Documentation (Signed)
Patient is resting comfortably. 

## 2016-09-10 NOTE — Progress Notes (Signed)
Patient attempted regular diet, reports nausea, offered clear liquids tolerating at this time. Family at bedside.

## 2016-09-10 NOTE — H&P (Signed)
Referring Physician(s): McComb,J  Supervising Physician: Arne Cleveland  Patient Status:  [facility] OP TBA  Chief Complaint:  Symptomatic uterine fibroids  Subjective: Patient familiar to IR service from recent consultation with Dr. Vernard Gambles in 07/31/16 to discuss treatment options for symptomatic uterine fibroids. She was deemed an appropriate candidate for bilateral uterine artery embolization and presents today for the procedure. She currently denies fever, headache, chest pain, dyspnea, cough, abdominal/back pain, nausea, vomiting. She does have menorrhagia. Past Medical History:  Diagnosis Date  . Anemia   . GERD (gastroesophageal reflux disease)   . Ovarian cyst   . Uterine fibroid    Past Surgical History:  Procedure Laterality Date  . LAPAROSCOPY     remove Ovarian Cyst       Allergies: Strawberry flavor  Medications: Prior to Admission medications   Medication Sig Start Date End Date Taking? Authorizing Provider  iron polysaccharides (NIFEREX) 150 MG capsule Take 1 capsule (150 mg total) by mouth daily. 07/20/16  Yes Rai, Ripudeep K, MD  pantoprazole (PROTONIX) 40 MG tablet Take 1 tablet (40 mg total) by mouth daily. 07/19/16  Yes Rai, Ripudeep K, MD  senna-docusate (SENOKOT-S) 8.6-50 MG tablet Take 1 tablet by mouth at bedtime. 07/19/16  Yes Rai, Ripudeep K, MD  ondansetron (ZOFRAN-ODT) 8 MG disintegrating tablet Take 1 tablet (8 mg total) by mouth every 8 (eight) hours as needed for nausea or vomiting. 07/19/16   Rai, Vernelle Emerald, MD     Vital Signs: BP 113/65 (BP Location: Right Arm)   Pulse 64   Temp 98.9 F (37.2 C) (Oral)   Resp 16   Ht 5\' 9"  (1.753 m)   Wt 197 lb (89.4 kg)   LMP 08/26/2016 (Exact Date)   SpO2 100%   BMI 29.09 kg/m   Physical Exam awake, alert. Chest clear to auscultation bilaterally. Heart with regular rate and rhythm. Abdomen soft, positive bowel sounds, nontender. Lower extremities with intact distal pulses, no edema.  Imaging: No  results found.  Labs:  CBC:  Recent Labs  07/18/16 1439 07/18/16 2109 07/19/16 0446 07/19/16 1824 09/10/16 0750  WBC 5.2 6.3 5.7  --  6.3  HGB 6.3* 6.5* 7.5* 8.6* 8.7*  HCT 23.8* 23.3* 26.0* 28.8* 29.0*  PLT 658* 544* 493*  --  583*    COAGS:  Recent Labs  07/19/16 0718 09/10/16 0750  INR 1.10 0.95  APTT  --  34    BMP:  Recent Labs  07/18/16 1439 07/19/16 0718 09/10/16 0750  NA 138 140 140  K 3.2* 3.9 3.6  CL 105 108 109  CO2 26 26 22   GLUCOSE 117* 90 101*  BUN 5* 9 10  CALCIUM 9.2 9.0 9.3  CREATININE 0.89 0.78 0.75  GFRNONAA >60 >60 >60  GFRAA >60 >60 >60    LIVER FUNCTION TESTS:  Recent Labs  07/18/16 1439 07/19/16 0718  BILITOT 0.3 0.5  AST 13* 13*  ALT 11* 11*  ALKPHOS 37* 34*  PROT 6.7 6.2*  ALBUMIN 4.1 3.6    Assessment and Plan: Patient with history of symptomatic uterine fibroids; seen in consultation on 07/31/16 and deemed an appropriate candidate for bilateral uterine artery embolization. She presents today for the procedure. Details/risks of procedure, including but not limited to, internal bleeding, infection, injury to adjacent structures, contrast nephropathy, nontarget embolization discussed with patient and spouse with their understanding and consent. Post procedure she'll be admitted for overnight observation for pain control.   Electronically Signed: D. Rowe Robert 09/10/2016,  9:15 AM   I spent a total of 30 minutes at the the patient's bedside AND on the patient's hospital floor or unit, greater than 50% of which was counseling/coordinating care for bilateral uterine artery embolization

## 2016-09-10 NOTE — Procedures (Signed)
Uterine Fibroid Arterial embolization No complication No blood loss. See complete dictation in [facility]. Will admit overnight for PCA, observation.

## 2016-09-11 ENCOUNTER — Other Ambulatory Visit: Payer: Self-pay | Admitting: Radiology

## 2016-09-11 DIAGNOSIS — D259 Leiomyoma of uterus, unspecified: Secondary | ICD-10-CM

## 2016-09-11 DIAGNOSIS — D251 Intramural leiomyoma of uterus: Secondary | ICD-10-CM | POA: Diagnosis not present

## 2016-09-11 NOTE — Progress Notes (Signed)
Pt will be discharging home with family. Discharge instructions given and all questions answered.

## 2016-09-11 NOTE — Discharge Instructions (Signed)
Uterine Artery Embolization for Fibroids, Care After  Refer to this sheet in the next few weeks. These instructions provide you with information on caring for yourself after your procedure. Your health care provider may also give you more specific instructions. Your treatment has been planned according to current medical practices, but problems sometimes occur. Call your health care provider if you have any problems or questions after your procedure.  What can I expect after the procedure?  After your procedure, it is typical to have cramping in the pelvis. You will be given pain medicine to control it.  Follow these instructions at home:  · Only take over-the-counter or prescription medicines for pain, discomfort, or fever as directed by your health care provider.  · Do not take aspirin. It can cause bleeding.  · Follow your health care provider’s advice regarding medicines given to you, diet, activity, and when to begin sexual activity.  · See your health care provider for follow-up care as directed.  Contact a health care provider if:  · You have a fever.  · You have redness, swelling, and pain around your incision site.  · You have pus draining from your incision.  · You have a rash.  Get help right away if:  · You have bleeding from your incision site.  · You have difficulty breathing.  · You have chest pain.  · You have severe abdominal pain.  · You have leg pain.  · You become dizzy and faint.  This information is not intended to replace advice given to you by your health care provider. Make sure you discuss any questions you have with your health care provider.  Document Released: 02/11/2013 Document Revised: 09/29/2015 Document Reviewed: 11/06/2012  Elsevier Interactive Patient Education © 2017 [employer]

## 2016-09-11 NOTE — Discharge Summary (Signed)
Patient ID: Desiree Nunez MRN: [id number] DOB/AGE: [birth year] 47 y.o.  Admit date: 09/10/2016 Discharge date: 09/11/2016  Supervising Physician: Desiree Nunez  Patient Status: [facility] - In-pt  Admission Diagnoses: Symptomatic uterine fibroids  Discharge Diagnoses: Symptomatic uterine fibroids, status post successful bilateral uterine uterine artery embolization on 09/10/16 Active Problems:   Fibroids, intramural  Past Medical History:  Diagnosis Date  . Anemia   . GERD (gastroesophageal reflux disease)   . Ovarian cyst   . Uterine fibroid    Past Surgical History:  Procedure Laterality Date  . IR ANGIOGRAM PELVIS SELECTIVE OR SUPRASELECTIVE  09/10/2016  . IR ANGIOGRAM PELVIS SELECTIVE OR SUPRASELECTIVE  09/10/2016  . IR ANGIOGRAM SELECTIVE EACH ADDITIONAL VESSEL  09/10/2016  . IR ANGIOGRAM SELECTIVE EACH ADDITIONAL VESSEL  09/10/2016  . IR EMBO TUMOR ORGAN ISCHEMIA INFARCT INC GUIDE ROADMAPPING  09/10/2016  . IR US GUIDE VASC ACCESS RIGHT  09/10/2016  . LAPAROSCOPY     remove Ovarian Cyst       Discharged Condition: good  Hospital Course: [name] is a 46 year old female with history of symptomatic uterine fibroids who was in consultation by Dr. Vernard Nunez on 07/31/16 to discuss treatment options. She was deemed an appropriate candidate for bilateral uterine artery embolization and underwent the procedure on 09/10/16. The procedure was performed without immediate complications and using IV conscious sedation. Post procedure she was admitted for overnight observation for pain control. Overnight the patient did experience some occasional nausea and vomiting as well as pelvic cramping. She was placed on a Dilaudid PCA pump and given Phenergan for nausea. On the morning of discharge she continued to have some mild nausea which was relieved with Phenergan as well as mild pelvic cramping. She also noted some mild vaginal spotting. She was able to ambulate, void and tolerate her diet subsequently  without significant difficulty. She was seen by Dr. Vernard Nunez and deemed appropriate for discharge home at this time. She was given prescriptions for Norco 5/25, #30, no refills, 1-2 tablets every 4-6 hours for moderate to severe pain, Zofran 8 mg, #20, no refills, 1 tablet twice daily as needed for nausea, ibuprofen 600 mg, #20, 1 tablet every 6 hours for the next 5 days, Colace 100 mg, #20, no refills, 1 tablet twice daily as needed for constipation. She will be scheduled for follow-up visit with Dr. Vernard Nunez in the [facility] in 4 weeks. She will continue her current home medications and follow up with Dr. Radene Nunez as needed.  Consults: none Significant Diagnostic Studies:  Results for orders placed or performed during the hospital encounter of 09/10/16  APTT  Result Value Ref Range   aPTT 34 24 - 36 seconds  Basic metabolic panel  Result Value Ref Range   Sodium 140 135 - 145 mmol/L   Potassium 3.6 3.5 - 5.1 mmol/L   Chloride 109 101 - 111 mmol/L   CO2 22 22 - 32 mmol/L   Glucose, Bld 101 (H) 65 - 99 mg/dL   BUN 10 6 - 20 mg/dL   Creatinine, Ser 0.75 0.44 - 1.00 mg/dL   Calcium 9.3 8.9 - 10.3 mg/dL   GFR calc non Af Amer >60 >60 mL/min   GFR calc Af Amer >60 >60 mL/min   Anion gap 9 5 - 15  hCG, serum, qualitative  Result Value Ref Range   Preg, Serum NEGATIVE NEGATIVE  Protime-INR  Result Value Ref Range   Prothrombin Time 12.7 11.4 - 15.2 seconds   INR 0.95  CBC with Differential/Platelet  Result Value Ref Range   WBC 6.3 4.0 - 10.5 K/uL   RBC 4.01 3.87 - 5.11 MIL/uL   Hemoglobin 8.7 (L) 12.0 - 15.0 g/dL   HCT 29.0 (L) 36.0 - 46.0 %   MCV 72.3 (L) 78.0 - 100.0 fL   MCH 21.7 (L) 26.0 - 34.0 pg   MCHC 30.0 30.0 - 36.0 g/dL   RDW 24.8 (H) 11.5 - 15.5 %   Platelets 583 (H) 150 - 400 K/uL   Neutrophils Relative % 50 %   Lymphocytes Relative 34 %   Monocytes Relative 11 %   Eosinophils Relative 5 %   Basophils Relative 0 %   Neutro Abs 3.2 1.7 - 7.7 K/uL   Lymphs Abs 2.1 0.7 -  4.0 K/uL   Monocytes Absolute 0.7 0.1 - 1.0 K/uL   Eosinophils Absolute 0.3 0.0 - 0.7 K/uL   Basophils Absolute 0.0 0.0 - 0.1 K/uL   RBC Morphology ELLIPTOCYTES      Treatments: Technically successful bilateral uterine artery embolization via IV conscious sedation on 09/10/16  Discharge Exam: Blood pressure (!) 124/57, pulse (!) 46, temperature 98.1 F (36.7 C), temperature source Oral, resp. rate 17, height 5\' 9"  (1.753 m), weight 197 lb (89.4 kg), last menstrual period 08/26/2016, SpO2 100 %. Patient slightly drowsy but arousable. Chest clear to auscultation bilaterally. Heart with slightly bradycardic but regular rhythm. Abdomen soft, positive bowel sounds, nontender. Puncture site right common femoral artery soft, clean, dry, nontender, no hematoma. Lower extremities with no edema and intact distal pulses.  Disposition: 01-Home or Self Care  Discharge Instructions    Call MD for:  difficulty breathing, headache or visual disturbances    Complete by:  As directed    Call MD for:  extreme fatigue    Complete by:  As directed    Call MD for:  hives    Complete by:  As directed    Call MD for:  persistant dizziness or light-headedness    Complete by:  As directed    Call MD for:  persistant nausea and vomiting    Complete by:  As directed    Call MD for:  redness, tenderness, or signs of infection (pain, swelling, redness, odor or green/yellow discharge around incision site)    Complete by:  As directed    Call MD for:  severe uncontrolled pain    Complete by:  As directed    Call MD for:  temperature >100.4    Complete by:  As directed    Change dressing (specify)    Complete by:  As directed    May change bandage over right groin and place new Band-Aid over site for the next 2-3 days. May wash site with soap and water.   Diet - low sodium heart healthy    Complete by:  As directed    Driving Restrictions    Complete by:  As directed    No driving for 48 hours   Increase  activity slowly    Complete by:  As directed    Lifting restrictions    Complete by:  As directed    No heavy lifting for the next 3-4 days   May shower / Bathe    Complete by:  As directed    May walk up steps    Complete by:  As directed    Sexual Activity Restrictions    Complete by:  As directed    No sexual intercourse  for 1 week     Allergies as of 09/11/2016      Reactions   Strawberry Flavor Swelling      Medication List    STOP taking these medications   ondansetron 8 MG disintegrating tablet Commonly known as:  ZOFRAN-ODT     TAKE these medications   acetaminophen 500 MG tablet Commonly known as:  TYLENOL Take 1,000 mg by mouth every 6 (six) hours as needed for mild pain or moderate pain.   iron polysaccharides 150 MG capsule Commonly known as:  NIFEREX Take 1 capsule (150 mg total) by mouth daily.   medroxyPROGESTERone 10 MG tablet Commonly known as:  PROVERA Take 10 mg by mouth daily.   pantoprazole 40 MG tablet Commonly known as:  PROTONIX Take 1 tablet (40 mg total) by mouth daily.   senna-docusate 8.6-50 MG tablet Commonly known as:  Senokot-S Take 1 tablet by mouth at bedtime.      Follow-up Information    Desiree Cleveland, MD. Call.   Specialty:  Interventional Radiology Why:  Radiology will call you to follow-up with Dr. Vernard Nunez in the [facility] in approximately 4 weeks; call [phone] or [phone] with any questions Contact information: [address] [place] [address] [phone]        Arvella Nigh, MD Follow up.   Specialty:  Obstetrics and Gynecology Why:  Continued gynecologic care with Dr. Radene Nunez as scheduled Contact information: [address] [place] [address] [phone]            Electronically Signed: D. Rowe Robert, PAC 09/11/2016, 11:13 AM   I have spent less than 30 minutes discharging Desiree Nunez.

## 2016-10-19 ENCOUNTER — Encounter: Payer: Self-pay | Admitting: Interventional Radiology

## 2016-10-30 ENCOUNTER — Ambulatory Visit
Admission: RE | Admit: 2016-10-30 | Discharge: 2016-10-30 | Disposition: A | Discharge status: Home / Self Care | Source: Ambulatory Visit | Attending: Radiology | Admitting: Radiology

## 2016-10-30 DIAGNOSIS — D259 Leiomyoma of uterus, unspecified: Secondary | ICD-10-CM

## 2016-10-30 HISTORY — PX: IR RADIOLOGIST EVAL & MGMT: IMG5224

## 2016-12-12 ENCOUNTER — Telehealth: Payer: Self-pay | Admitting: Radiology

## 2016-12-12 NOTE — Telephone Encounter (Signed)
Patient doing well 3 mo s/p [place].  Has not had her regular menstrual cycle post [place].  Occasional spotting.  For 6 mo follow up late Autumn 2018.  Kina Shiffman Riki Rusk, RN 12/12/2016 10:59 AM

## 2017-02-20 ENCOUNTER — Other Ambulatory Visit (HOSPITAL_COMMUNITY): Payer: Self-pay | Admitting: Interventional Radiology

## 2017-02-20 DIAGNOSIS — D219 Benign neoplasm of connective and other soft tissue, unspecified: Secondary | ICD-10-CM

## 2017-03-12 ENCOUNTER — Encounter: Payer: Self-pay | Admitting: Radiology

## 2017-03-14 ENCOUNTER — Encounter: Payer: Self-pay | Admitting: Radiology

## 2019-07-29 ENCOUNTER — Other Ambulatory Visit: Payer: Self-pay

## 2019-07-29 ENCOUNTER — Encounter (HOSPITAL_COMMUNITY): Payer: Self-pay

## 2019-07-29 ENCOUNTER — Ambulatory Visit (HOSPITAL_COMMUNITY)
Admission: EM | Admit: 2019-07-29 | Discharge: 2019-07-29 | Disposition: A | Discharge status: Home / Self Care | Attending: Family Medicine | Admitting: Family Medicine

## 2019-07-29 DIAGNOSIS — H6981 Other specified disorders of Eustachian tube, right ear: Secondary | ICD-10-CM | POA: Diagnosis not present

## 2019-07-29 DIAGNOSIS — H6991 Unspecified Eustachian tube disorder, right ear: Secondary | ICD-10-CM

## 2019-07-29 MED ORDER — FLUTICASONE PROPIONATE 50 MCG/ACT NA SUSP: 1.0000 | Freq: Every day | NASAL | 2 refills | Status: DC

## 2019-07-29 NOTE — ED Provider Notes (Signed)
[facility]    CSN: [id number] Arrival date & time: 07/29/19  [id number]      History   Chief Complaint Chief Complaint  Patient presents with  . Otalgia    HPI Desiree Nunez is a 49 y.o. female.   Patient is a 49 year old female with past medical history of anemia, GERD.  She presents today with approximately 3 days of right ear discomfort.  Described as pressure.  Pain somewhat radiates to right throat area.  Feels like drainage in her throat at times.  She does have seasonal allergies.  Has not been taking any allergy medication.  She took Tylenol with mild relief of the ear pain.  No sneezing, nasal congestion, rhinorrhea, fever or coughing.  No drainage from the ear.  ROS per HPI      Past Medical History:  Diagnosis Date  . Anemia   . GERD (gastroesophageal reflux disease)   . Ovarian cyst   . Uterine fibroid     Patient Active Problem List   Diagnosis Date Noted  . Fibroids, intramural 09/10/2016  . Symptomatic anemia 07/18/2016  . Chest pain 07/18/2016  . Tobacco abuse 07/18/2016  . Hypokalemia 07/18/2016  . Fibroids 07/18/2016  . Epigastric pain 07/18/2016    Past Surgical History:  Procedure Laterality Date  . IR ANGIOGRAM PELVIS SELECTIVE OR SUPRASELECTIVE  09/10/2016  . IR ANGIOGRAM PELVIS SELECTIVE OR SUPRASELECTIVE  09/10/2016  . IR ANGIOGRAM SELECTIVE EACH ADDITIONAL VESSEL  09/10/2016  . IR ANGIOGRAM SELECTIVE EACH ADDITIONAL VESSEL  09/10/2016  . IR EMBO TUMOR ORGAN ISCHEMIA INFARCT INC GUIDE ROADMAPPING  09/10/2016  . IR RADIOLOGIST EVAL & MGMT  07/31/2016  . IR RADIOLOGIST EVAL & MGMT  10/30/2016  . IR US GUIDE VASC ACCESS RIGHT  09/10/2016  . LAPAROSCOPY     remove Ovarian Cyst     OB History   No obstetric history on file.      Home Medications    Prior to Admission medications   Medication Sig Start Date End Date Taking? Authorizing Provider  acetaminophen (TYLENOL) 500 MG tablet Take 1,000 mg by mouth every 6 (six) hours as needed  for mild pain or moderate pain.    [provider]  fluticasone (FLONASE) 50 MCG/ACT nasal spray Place 1 spray into both nostrils daily. 07/29/19   Loura Halt A, NP  iron polysaccharides (NIFEREX) 150 MG capsule Take 1 capsule (150 mg total) by mouth daily. 07/20/16   Rai, Vernelle Emerald, MD  medroxyPROGESTERone (PROVERA) 10 MG tablet Take 10 mg by mouth daily. 07/26/16   [provider]  pantoprazole (PROTONIX) 40 MG tablet Take 1 tablet (40 mg total) by mouth daily. 07/19/16   Rai, Ripudeep K, MD  senna-docusate (SENOKOT-S) 8.6-50 MG tablet Take 1 tablet by mouth at bedtime. 07/19/16   Mendel Corning, MD    Family History Family History  Problem Relation Age of Onset  . CAD Father   . Hypertension Sister   . Diabetes Other   . Cancer Neg Hx     Social History Social History   Tobacco Use  . Smoking status: Former Smoker    Packs/day: 0.50    Years: 33.00    Pack years: 16.50    Types: Cigarettes    Quit date: 02/2018    Years since quitting: 1.4  . Smokeless tobacco: Never Used  Substance Use Topics  . Alcohol use: Yes    Comment: rare  . Drug use: No  Allergies   Strawberry flavor   Review of Systems Review of Systems   Physical Exam Triage Vital Signs ED Triage Vitals  Enc Vitals Group     BP 07/29/19 0819 116/78     Pulse Rate 07/29/19 0819 62     Resp 07/29/19 0819 16     Temp 07/29/19 0819 98.8 F (37.1 C)     Temp Source 07/29/19 0819 Oral     SpO2 07/29/19 0819 97 %     Weight 07/29/19 0815 238 lb (108 kg)     Height --      Head Circumference --      Peak Flow --      Pain Score 07/29/19 0815 8     Pain Loc --      Pain Edu? --      Excl. in [place]? --    No data found.  Updated Vital Signs BP 116/78 (BP Location: Left Arm)   Pulse 62   Temp 98.8 F (37.1 C) (Oral)   Resp 16   Wt 238 lb (108 kg)   SpO2 97%   BMI 35.15 kg/m   Visual Acuity Right Eye Distance:   Left Eye Distance:   Bilateral Distance:    Right Eye  Near:   Left Eye Near:    Bilateral Near:     Physical Exam Vitals and nursing note reviewed.  Constitutional:      General: She is not in acute distress.    Appearance: Normal appearance. She is not ill-appearing, toxic-appearing or diaphoretic.  HENT:     Head: Normocephalic and atraumatic.     Right Ear: Tympanic membrane and ear canal normal.     Left Ear: Tympanic membrane and ear canal normal.     Ears:     Comments: Mild tenderness to palpation of the tragus  No mastoid tenderness.  No swelling or drainage from the ear.     Nose: Nose normal.     Mouth/Throat:     Pharynx: Oropharynx is clear.  Eyes:     Conjunctiva/sclera: Conjunctivae normal.  Pulmonary:     Effort: Pulmonary effort is normal.  Musculoskeletal:        General: Normal range of motion.     Cervical back: Normal range of motion.  Skin:    General: Skin is warm and dry.  Neurological:     Mental Status: She is alert.  Psychiatric:        Mood and Affect: Mood normal.      UC Treatments / Results  Labs (all labs ordered are listed, but only abnormal results are displayed) Labs Reviewed - No data to display  EKG   Radiology No results found.  Procedures Procedures (including critical care time)  Medications Ordered in UC Medications - No data to display  Initial Impression / Assessment and Plan / UC Course  I have reviewed the triage vital signs and the nursing notes.  Pertinent labs & imaging results that were available during my care of the patient were reviewed by me and considered in my medical decision making (see chart for details).     Eustachian tube dysfunction. Treated with Flonase and Zyrtec. Follow up as needed for continued or worsening symptoms  Final Clinical Impressions(s) / UC Diagnoses   Final diagnoses:  Dysfunction of right eustachian tube     Discharge Instructions     Do the Flonase and Zyrtec daily. Follow up as needed for continued or worsening  symptoms      ED Prescriptions    Medication Sig Dispense Auth. Provider   fluticasone (FLONASE) 50 MCG/ACT nasal spray Place 1 spray into both nostrils daily. 16 g Loura Halt A, NP     PDMP not reviewed this encounter.   Orvan July, NP 07/29/19 [phone]

## 2019-07-29 NOTE — Discharge Instructions (Addendum)
Do the Flonase and Zyrtec daily. Follow up as needed for continued or worsening symptoms

## 2019-07-29 NOTE — ED Triage Notes (Signed)
Pt complaining of right sided ear pain that started on Sunday. States it feels like it is starting to "drain into throat."

## 2020-02-26 ENCOUNTER — Encounter: Payer: Self-pay | Admitting: Emergency Medicine

## 2020-02-26 ENCOUNTER — Ambulatory Visit (INDEPENDENT_AMBULATORY_CARE_PROVIDER_SITE_OTHER)

## 2020-02-26 ENCOUNTER — Other Ambulatory Visit: Payer: Self-pay

## 2020-02-26 ENCOUNTER — Ambulatory Visit
Admission: EM | Admit: 2020-02-26 | Discharge: 2020-02-26 | Disposition: A | Discharge status: Home / Self Care | Attending: Family Medicine | Admitting: Family Medicine

## 2020-02-26 DIAGNOSIS — M25561 Pain in right knee: Secondary | ICD-10-CM

## 2020-02-26 DIAGNOSIS — S8391XA Sprain of unspecified site of right knee, initial encounter: Secondary | ICD-10-CM

## 2020-02-26 MED ORDER — IBUPROFEN 800 MG PO TABS: 800.0000 mg | ORAL_TABLET | Freq: Three times a day (TID) | ORAL | 0 refills | Status: DC | PRN

## 2020-02-26 MED ORDER — CYCLOBENZAPRINE HCL 10 MG PO TABS: 10.0000 mg | ORAL_TABLET | Freq: Two times a day (BID) | ORAL | 0 refills | Status: DC | PRN

## 2020-02-26 NOTE — ED Provider Notes (Signed)
[place]   [id number] 02/26/20 Arrival Time: 1154  IH:WTUUE PAIN  SUBJECTIVE: History from: patient. Desiree Nunez is a 49 y.o. female complains of right knee pain since last night. Denies a precipitating event or specific injury. Localizes the pain to the .  Describes the pain as constant/ intermittent and achy in character. Has tried OTC medications without relief.  Symptoms are made worse with activity. Denies similar symptoms in the past. Denies fever, chills, erythema, ecchymosis, effusion, weakness, numbness and tingling, saddle paresthesias, loss of bowel or bladder function.      ROS: As per HPI.  All other pertinent ROS negative.     Past Medical History:  Diagnosis Date  . Anemia   . GERD (gastroesophageal reflux disease)   . Ovarian cyst   . Uterine fibroid    Past Surgical History:  Procedure Laterality Date  . IR ANGIOGRAM PELVIS SELECTIVE OR SUPRASELECTIVE  09/10/2016  . IR ANGIOGRAM PELVIS SELECTIVE OR SUPRASELECTIVE  09/10/2016  . IR ANGIOGRAM SELECTIVE EACH ADDITIONAL VESSEL  09/10/2016  . IR ANGIOGRAM SELECTIVE EACH ADDITIONAL VESSEL  09/10/2016  . IR EMBO TUMOR ORGAN ISCHEMIA INFARCT INC GUIDE ROADMAPPING  09/10/2016  . IR RADIOLOGIST EVAL & MGMT  07/31/2016  . IR RADIOLOGIST EVAL & MGMT  10/30/2016  . IR US GUIDE VASC ACCESS RIGHT  09/10/2016  . LAPAROSCOPY     remove Ovarian Cyst    Allergies  Allergen Reactions  . Strawberry Flavor Swelling   No current facility-administered medications on file prior to encounter.   Current Outpatient Medications on File Prior to Encounter  Medication Sig Dispense Refill  . medroxyPROGESTERone (PROVERA) 10 MG tablet Take 10 mg by mouth daily.    Marland Kitchen acetaminophen (TYLENOL) 500 MG tablet Take 1,000 mg by mouth every 6 (six) hours as needed for mild pain or moderate pain.    . fluticasone (FLONASE) 50 MCG/ACT nasal spray Place 1 spray into both nostrils daily. 16 g 2  . iron polysaccharides (NIFEREX) 150 MG capsule Take  1 capsule (150 mg total) by mouth daily. 30 capsule 4  . pantoprazole (PROTONIX) 40 MG tablet Take 1 tablet (40 mg total) by mouth daily. 30 tablet 4  . senna-docusate (SENOKOT-S) 8.6-50 MG tablet Take 1 tablet by mouth at bedtime. 30 tablet 4   Social History   Socioeconomic History  . Marital status: Married    Spouse name: Not on file  . Number of children: Not on file  . Years of education: Not on file  . Highest education level: Not on file  Occupational History  . Not on file  Tobacco Use  . Smoking status: Former Smoker    Packs/day: 0.50    Years: 33.00    Pack years: 16.50    Types: Cigarettes    Quit date: 02/2018    Years since quitting: 2.0  . Smokeless tobacco: Never Used  Vaping Use  . Vaping Use: Former  . Start date: 05/07/2014  . Quit date: 07/06/2014  Substance and Sexual Activity  . Alcohol use: Yes    Comment: rare  . Drug use: No  . Sexual activity: Not on file  Other Topics Concern  . Not on file  Social History Narrative  . Not on file   Social Determinants of Health   Financial Resource Strain:   . Difficulty of Paying Living Expenses: Not on file  Food Insecurity:   . Worried About Charity fundraiser in the Last Year: Not on file  .  Ran Out of Food in the Last Year: Not on file  Transportation Needs:   . Lack of Transportation (Medical): Not on file  . Lack of Transportation (Non-Medical): Not on file  Physical Activity:   . Days of Exercise per Week: Not on file  . Minutes of Exercise per Session: Not on file  Stress:   . Feeling of Stress : Not on file  Social Connections:   . Frequency of Communication with Friends and Family: Not on file  . Frequency of Social Gatherings with Friends and Family: Not on file  . Attends Religious Services: Not on file  . Active Member of Clubs or Organizations: Not on file  . Attends Archivist Meetings: Not on file  . Marital Status: Not on file  Intimate Partner Violence:   . Fear of  Current or Ex-Partner: Not on file  . Emotionally Abused: Not on file  . Physically Abused: Not on file  . Sexually Abused: Not on file   Family History  Problem Relation Age of Onset  . CAD Father   . Hypertension Sister   . Diabetes Other   . Cancer Neg Hx     OBJECTIVE:  Vitals:   02/26/20 1234 02/26/20 1236  BP:  124/81  Pulse:  63  Resp:  16  Temp:  98.6 F (37 C)  TempSrc:  Oral  SpO2:  95%  Weight: 220 lb (99.8 kg)   Height: 5\' 9"  (1.753 m)     General appearance: ALERT; in no acute distress.  Head: NCAT Lungs: Normal respiratory effort CV: pulses 2+ bilaterally. Cap refill < 2 seconds Musculoskeletal:  Inspection: Skin warm, dry, clear and intact without obvious erythema, effusion, or ecchymosis.  Palpation: R medial knee tender to palpation ROM: limited ROM active and passive to R knee Stability: Anterior/ posterior drawer intact Skin: warm and dry Neurologic: Ambulates without difficulty; Sensation intact about the upper/ lower extremities Psychological: alert and cooperative; normal mood and affect  DIAGNOSTIC STUDIES:  DG Knee Complete 4 Views Right  Result Date: 02/26/2020 CLINICAL DATA:  Right knee pain and swelling. EXAM: RIGHT KNEE - COMPLETE 4+ VIEW COMPARISON:  None. FINDINGS: No evidence of fracture, dislocation, or joint effusion. No evidence of arthropathy or other focal bone abnormality. Soft tissues are unremarkable. IMPRESSION: Negative. Electronically Signed   By: Franchot Gallo M.D.   On: 02/26/2020 13:23     ASSESSMENT & PLAN:  1. Acute pain of right knee   2. Sprain of right knee, unspecified ligament, initial encounter      Meds ordered this encounter  Medications  . ibuprofen (ADVIL) 800 MG tablet    Sig: Take 1 tablet (800 mg total) by mouth every 8 (eight) hours as needed for moderate pain.    Dispense:  21 tablet    Refill:  0    Order Specific Question:   Supervising Provider    Answer:   Chase Picket [id number]  .  cyclobenzaprine (FLEXERIL) 10 MG tablet    Sig: Take 1 tablet (10 mg total) by mouth 2 (two) times daily as needed for muscle spasms.    Dispense:  20 tablet    Refill:  0    Order Specific Question:   Supervising Provider    Answer:   Chase Picket [id number]   Negative xray today Crutches given Knee brace applied in office Continue conservative management of rest, ice, and gentle stretches Take ibuprofen as needed for pain relief (may cause  abdominal discomfort, ulcers, and GI bleeds avoid taking with other NSAIDs) Take cyclobenzaprine at nighttime for symptomatic relief. Avoid driving or operating heavy machinery while using medication. Follow up with ortho if symptoms persist Return or go to the ER if you have any new or worsening symptoms (fever, chills, chest pain, abdominal pain, changes in bowel or bladder habits, pain radiating into lower legs)   Reviewed expectations re: course of current medical issues. Questions answered. Outlined signs and symptoms indicating need for more acute intervention. Patient verbalized understanding. After Visit Summary given.       Faustino Congress, NP 02/26/20 [phone]

## 2020-02-26 NOTE — ED Triage Notes (Signed)
Patient c/o RT leg pain since last night.   Patient stated pain is located on the "side of knee".   Patient denies taking any medication for pain.   Patient stated she had an episode like this 10 years ago. The diagnosis was a muscle strain.

## 2020-02-26 NOTE — Discharge Instructions (Signed)
Take ibuprofen as needed. Rest and elevate your leg. Apply ice packs 2-3 times a day for up to 20 minutes each. Wear the brace as needed for comfort.    Follow up with your primary care provider or an orthopedist if you symptoms continue or worsen;  Or if you develop new symptoms, such as numbness, tingling, or weakness.

## 2020-12-31 ENCOUNTER — Ambulatory Visit
Admission: EM | Admit: 2020-12-31 | Discharge: 2020-12-31 | Disposition: A | Discharge status: Home / Self Care | Attending: Family Medicine | Admitting: Family Medicine

## 2020-12-31 ENCOUNTER — Encounter: Payer: Self-pay | Admitting: Emergency Medicine

## 2020-12-31 ENCOUNTER — Ambulatory Visit (INDEPENDENT_AMBULATORY_CARE_PROVIDER_SITE_OTHER)

## 2020-12-31 ENCOUNTER — Other Ambulatory Visit: Payer: Self-pay

## 2020-12-31 DIAGNOSIS — M79671 Pain in right foot: Secondary | ICD-10-CM | POA: Diagnosis not present

## 2020-12-31 DIAGNOSIS — M7750 Other enthesopathy of unspecified foot: Secondary | ICD-10-CM | POA: Diagnosis not present

## 2020-12-31 MED ORDER — KETOROLAC TROMETHAMINE 30 MG/ML IJ SOLN
30.0000 mg | Freq: Once | INTRAMUSCULAR | Status: AC
  Administered 2020-12-31: 30 mg via INTRAMUSCULAR

## 2020-12-31 MED ORDER — INDOMETHACIN 50 MG PO CAPS: 50.0000 mg | ORAL_CAPSULE | Freq: Two times a day (BID) | ORAL | 0 refills | Status: DC

## 2020-12-31 NOTE — ED Provider Notes (Signed)
Roderic [facility]    CSN: [id number] Arrival date & time: 12/31/20  1325      History   Chief Complaint Chief Complaint  Patient presents with   Foot Pain    HPI Desiree Nunez is a 50 y.o. female.   HPI Patient presents today for evaluation of right foot pain.  Patient is having pain at the distal fifth and lateral upper right foot.  She denies injury.  The pain initially started back in May of this year however today the pain became excruciating and she was having pain with applying compression socks or shoes.  No known history of bone spurs or recurrent foot pain.  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  She has not taken any medications today.  Past Medical History:  Diagnosis Date   Anemia    GERD (gastroesophageal reflux disease)    Ovarian cyst    Uterine fibroid     Patient Active Problem List   Diagnosis Date Noted   Fibroids, intramural 09/10/2016   Symptomatic anemia 07/18/2016   Chest pain 07/18/2016   Tobacco abuse 07/18/2016   Hypokalemia 07/18/2016   Fibroids 07/18/2016   Epigastric pain 07/18/2016    Past Surgical History:  Procedure Laterality Date   IR ANGIOGRAM PELVIS SELECTIVE OR SUPRASELECTIVE  09/10/2016   IR ANGIOGRAM PELVIS SELECTIVE OR SUPRASELECTIVE  09/10/2016   IR ANGIOGRAM SELECTIVE EACH ADDITIONAL VESSEL  09/10/2016   IR ANGIOGRAM SELECTIVE EACH ADDITIONAL VESSEL  09/10/2016   IR EMBO TUMOR ORGAN ISCHEMIA INFARCT INC GUIDE ROADMAPPING  09/10/2016   IR RADIOLOGIST EVAL & MGMT  07/31/2016   IR RADIOLOGIST EVAL & MGMT  10/30/2016   IR US GUIDE VASC ACCESS RIGHT  09/10/2016   LAPAROSCOPY     remove Ovarian Cyst     OB History   No obstetric history on file.      Home Medications    Prior to Admission medications   Medication Sig Start Date End Date Taking? Authorizing Provider  indomethacin (INDOCIN) 50 MG capsule Take 1 capsule (50 mg total) by mouth 2 (two) times daily with a meal. 12/31/20  Yes Scot Jun, FNP  acetaminophen (TYLENOL) 500 MG tablet Take 1,000 mg by mouth every 6 (six) hours as needed for mild pain or moderate pain.    [provider]  cyclobenzaprine (FLEXERIL) 10 MG tablet Take 1 tablet (10 mg total) by mouth 2 (two) times daily as needed for muscle spasms. 02/26/20   Faustino Congress, NP  fluticasone (FLONASE) 50 MCG/ACT nasal spray Place 1 spray into both nostrils daily. 07/29/19   Loura Halt A, NP  ibuprofen (ADVIL) 800 MG tablet Take 1 tablet (800 mg total) by mouth every 8 (eight) hours as needed for moderate pain. 02/26/20   Faustino Congress, NP  iron polysaccharides (NIFEREX) 150 MG capsule Take 1 capsule (150 mg total) by mouth daily. 07/20/16   Rai, Vernelle Emerald, MD  medroxyPROGESTERone (PROVERA) 10 MG tablet Take 10 mg by mouth daily. 07/26/16   [provider]  pantoprazole (PROTONIX) 40 MG tablet Take 1 tablet (40 mg total) by mouth daily. 07/19/16   Rai, Ripudeep K, MD  senna-docusate (SENOKOT-S) 8.6-50 MG tablet Take 1 tablet by mouth at bedtime. 07/19/16   Mendel Corning, MD    Family History Family History  Problem Relation Age of Onset   CAD Father    Hypertension Sister    Diabetes Other  Cancer Neg Hx     Social History Social History   Tobacco Use   Smoking status: Former    Packs/day: 0.50    Years: 33.00    Pack years: 16.50    Types: Cigarettes    Quit date: 02/2018    Years since quitting: 2.9   Smokeless tobacco: Never  Vaping Use   Vaping Use: Former   Start date: 05/07/2014   Quit date: 07/06/2014  Substance Use Topics   Alcohol use: Yes    Comment: rare   Drug use: No     Allergies   Strawberry flavor   Review of Systems Review of Systems Pertinent negatives listed in HPI  Physical Exam Triage Vital Signs ED Triage Vitals  Enc Vitals Group     BP 12/31/20 1334 (!) 147/80     Pulse Rate 12/31/20 1334 72     Resp 12/31/20 1334 18     Temp 12/31/20 1334 98.3 F (36.8 C)     Temp Source 12/31/20  1334 Oral     SpO2 12/31/20 1334 95 %     Weight --      Height --      Head Circumference --      Peak Flow --      Pain Score 12/31/20 1340 8     Pain Loc --      Pain Edu? --      Excl. in [place]? --    No data found.  Updated Vital Signs BP (!) 147/80 (BP Location: Left Arm)   Pulse 72   Temp 98.3 F (36.8 C) (Oral)   Resp 18   SpO2 95%   Visual Acuity Right Eye Distance:   Left Eye Distance:   Bilateral Distance:    Right Eye Near:   Left Eye Near:    Bilateral Near:     Physical Exam Constitutional:      Appearance: Normal appearance.  Cardiovascular:     Rate and Rhythm: Normal rate and regular rhythm.  Pulmonary:     Effort: Pulmonary effort is normal.     Breath sounds: Normal breath sounds.  Musculoskeletal:       Feet:  Feet:     Right foot:     Skin integrity: Skin integrity normal.     Toenail Condition: Right toenails are normal.  Neurological:     Mental Status: She is alert.     UC Treatments / Results  Labs (all labs ordered are listed, but only abnormal results are displayed) Labs Reviewed - No data to display  EKG   Radiology DG Foot Complete Right  Result Date: 12/31/2020 CLINICAL DATA:  Chronic right foot pain without known injury. EXAM: RIGHT FOOT COMPLETE - 3+ VIEW COMPARISON:  None. FINDINGS: There is no evidence of fracture or dislocation. There is no evidence of arthropathy or other focal bone abnormality. Soft tissues are unremarkable. IMPRESSION: Negative. Electronically Signed   By: Marijo Conception M.D.   On: 12/31/2020 14:00    Procedures Procedures (including critical care time)  Medications Ordered in UC Medications  ketorolac (TORADOL) 30 MG/ML injection 30 mg (30 mg Intramuscular Given 12/31/20 1403)    Initial Impression / Assessment and Plan / UC Course  I have reviewed the triage vital signs and the nursing notes.  Pertinent labs & imaging results that were available during my care of the patient were reviewed  by me and considered in my medical decision making (see chart for  details).    Patient presents today with right foot pain treating for right foot tendinitis. Treatment today with Toradol 30 mg IM.  Patient will start indomethacin tomorrow.  She will continue RICE at home. Advised to follow-up with the podiatrist if symptoms do not completely resolve. Final Clinical Impressions(s) / UC Diagnoses   Final diagnoses:  Foot tendinitis, Right     Discharge Instructions      Follow-up with podiatry if treatment failure occurs or if treatment subsides pain however there is any degree of discomfort remaining following treatment with indomethacin. Start indomethacin tomorrow.  You received a Toradol injection here in clinic which is an anti-inflammatory.  Take medication with food.     ED Prescriptions     Medication Sig Dispense Auth. Provider   indomethacin (INDOCIN) 50 MG capsule Take 1 capsule (50 mg total) by mouth 2 (two) times daily with a meal. 20 capsule Scot Jun, FNP      PDMP not reviewed this encounter.   Scot Jun, FNP 12/31/20 [id number]

## 2020-12-31 NOTE — Discharge Instructions (Addendum)
Follow-up with podiatry if treatment failure occurs or if treatment subsides pain however there is any degree of discomfort remaining following treatment with indomethacin. Start indomethacin tomorrow.  You received a Toradol injection here in clinic which is an anti-inflammatory.  Take medication with food.

## 2020-12-31 NOTE — ED Triage Notes (Signed)
Pt here with right foot pain since May with no mechanism of injury, no swelling, and no discoloration. Initially felt better with compression, but the pain is much worse today and hurts with compression now.

## 2021-08-01 ENCOUNTER — Telehealth: Admitting: Nurse Practitioner

## 2021-08-01 DIAGNOSIS — M25512 Pain in left shoulder: Secondary | ICD-10-CM | POA: Diagnosis not present

## 2021-08-01 NOTE — Progress Notes (Signed)
? ?Virtual Visit Consent  ? ?Desiree Nunez, you are scheduled for a virtual visit with Mary-Margaret Hassell Done, [place], a [facility] provider, today.   ?  ?Just as with appointments in the office, your consent must be obtained to participate.  Your consent will be active for this visit and any virtual visit you may have with one of our providers in the next 365 days.   ?  ?If you have a MyChart account, a copy of this consent can be sent to you electronically.  All virtual visits are billed to your insurance company just like a traditional visit in the office.   ? ?As this is a virtual visit, video technology does not allow for your provider to perform a traditional examination.  This may limit your provider's ability to fully assess your condition.  If your provider identifies any concerns that need to be evaluated in person or the need to arrange testing (such as labs, EKG, etc.), we will make arrangements to do so.   ?  ?Although advances in technology are sophisticated, we cannot ensure that it will always work on either your end or our end.  If the connection with a video visit is poor, the visit may have to be switched to a telephone visit.  With either a video or telephone visit, we are not always able to ensure that we have a secure connection.    ? ?I need to obtain your verbal consent now.   Are you willing to proceed with your visit today? YES ?  ?Thailyn Khalid has provided verbal consent on 08/01/2021 for a virtual visit (video or telephone). ?  ?Mary-Margaret Hassell Done, FNP  ? ?Date: 08/01/2021 7:36 PM ? ? ?Virtual Visit via Video Note  ? ?I, Mary-Margaret Hassell Done, connected with Desiree Nunez ([id number], 25-Jul-1970) on 08/01/21 at  7:45 PM EDT by a video-enabled telemedicine application and verified that I am speaking with the correct person using two identifiers. ? ?Location: ?Patient: Virtual Visit Location Patient: Home ?Provider: Virtual Visit Location Provider: Mobile ?  ?I discussed the limitations of  evaluation and management by telemedicine and the availability of in person appointments. The patient expressed understanding and agreed to proceed.   ? ?History of Present Illness: ?Desiree Nunez is a 51 y.o. who identifies as a female who was assigned female at birth, and is being seen today for fell down steps. ? ?HPI: Patient states she fell down some steps and injured her left shoulder. She says she does not think it is broke but she does have limited movement and she has limited strength in her fingers when grasping things. ?  ?Review of Systems  ?Musculoskeletal:  Positive for joint pain (left shoulder).  ? ?Problems:  ?Patient Active Problem List  ? Diagnosis Date Noted  ? Fibroids, intramural 09/10/2016  ? Symptomatic anemia 07/18/2016  ? Chest pain 07/18/2016  ? Tobacco abuse 07/18/2016  ? Hypokalemia 07/18/2016  ? Fibroids 07/18/2016  ? Epigastric pain 07/18/2016  ?  ?Allergies:  ?Allergies  ?Allergen Reactions  ? Strawberry Flavor Swelling  ? ?Medications:  ?Current Outpatient Medications:  ?  acetaminophen (TYLENOL) 500 MG tablet, Take 1,000 mg by mouth every 6 (six) hours as needed for mild pain or moderate pain., Disp: , Rfl:  ?  cyclobenzaprine (FLEXERIL) 10 MG tablet, Take 1 tablet (10 mg total) by mouth 2 (two) times daily as needed for muscle spasms., Disp: 20 tablet, Rfl: 0 ?  fluticasone (FLONASE) 50 MCG/ACT nasal spray, Place 1 spray  into both nostrils daily., Disp: 16 g, Rfl: 2 ?  ibuprofen (ADVIL) 800 MG tablet, Take 1 tablet (800 mg total) by mouth every 8 (eight) hours as needed for moderate pain., Disp: 21 tablet, Rfl: 0 ?  indomethacin (INDOCIN) 50 MG capsule, Take 1 capsule (50 mg total) by mouth 2 (two) times daily with a meal., Disp: 20 capsule, Rfl: 0 ?  iron polysaccharides (NIFEREX) 150 MG capsule, Take 1 capsule (150 mg total) by mouth daily., Disp: 30 capsule, Rfl: 4 ?  medroxyPROGESTERone (PROVERA) 10 MG tablet, Take 10 mg by mouth daily., Disp: , Rfl:  ?  pantoprazole  (PROTONIX) 40 MG tablet, Take 1 tablet (40 mg total) by mouth daily., Disp: 30 tablet, Rfl: 4 ?  senna-docusate (SENOKOT-S) 8.6-50 MG tablet, Take 1 tablet by mouth at bedtime., Disp: 30 tablet, Rfl: 4 ? ?Observations/Objective: ?Patient is well-developed, well-nourished in no acute distress.  ?Resting comfortably  at home.  ?Head is normocephalic, atraumatic.  ?No labored breathing.  ?Speech is clear and coherent with logical content.  ?Patient is alert and oriented at baseline.  ?Limited abduction and internal rotation of left shoulder. ?Patient states grip is weak on left due to pain ? ?Assessment and Plan: ? ?Desiree Nunez in today with chief complaint of No chief complaint on file. ? ? ?1. Acute pain of left shoulder ?Ice ?Take naprosyn that she already has ?Go get xrays tomorrow ?Makeshift sling to immobilize ? ? ? ?Follow Up Instructions: ?I discussed the assessment and treatment plan with the patient. The patient was provided an opportunity to ask questions and all were answered. The patient agreed with the plan and demonstrated an understanding of the instructions.  A copy of instructions were sent to the patient via MyChart. ? ?The patient was advised to call back or seek an in-person evaluation if the symptoms worsen or if the condition fails to improve as anticipated. ? ?Time:  ?I spent 20 minutes with the patient via telehealth technology discussing the above problems/concerns.   ? ?Mary-Margaret Hassell Done, FNP ? ?

## 2021-08-01 NOTE — Patient Instructions (Signed)
Shoulder Pain °Many things can cause shoulder pain, including: °An injury to the shoulder. °Overuse of the shoulder. °Arthritis. °The source of the pain can be: °Inflammation. °An injury to the shoulder joint. °An injury to a tendon, ligament, or bone. °Follow these instructions at home: °Pay attention to changes in your symptoms. Let your health care provider know about them. Follow these instructions to relieve your pain. °If you have a sling: °Wear the sling as told by your health care provider. Remove it only as told by your health care provider. °Loosen the sling if your fingers tingle, become numb, or turn cold and blue. °Keep the sling clean. °If the sling is not waterproof: °Do not let it get wet. Remove it to shower or bathe. °Move your arm as little as possible, but keep your hand moving to prevent swelling. °Managing pain, stiffness, and swelling ° °If directed, put ice on the painful area: °Put ice in a plastic bag. °Place a towel between your skin and the bag. °Leave the ice on for 20 minutes, 2-3 times per day. Stop applying ice if it does not help with the pain. °Squeeze a soft ball or a foam pad as much as possible. This helps to keep the shoulder from swelling. It also helps to strengthen the arm. °General instructions °Take over-the-counter and prescription medicines only as told by your health care provider. °Keep all follow-up visits as told by your health care provider. This is important. °Contact a health care provider if: °Your pain gets worse. °Your pain is not relieved with medicines. °New pain develops in your arm, hand, or fingers. °Get help right away if: °Your arm, hand, or fingers: °Tingle. °Become numb. °Become swollen. °Become painful. °Turn white or blue. °Summary °Shoulder pain can be caused by an injury, overuse, or arthritis. °Pay attention to changes in your symptoms. Let your health care provider know about them. °This condition may be treated with a sling, ice, and pain  medicines. °Contact your health care provider if the pain gets worse or new pain develops. Get help right away if your arm, hand, or fingers tingle or become numb, swollen, or painful. °Keep all follow-up visits as told by your health care provider. This is important. °This information is not intended to replace advice given to you by your health care provider. Make sure you discuss any questions you have with your health care provider. °Document Revised: 11/05/2017 Document Reviewed: 11/05/2017 °Elsevier Patient Education © 2022 [employer] ° °

## 2021-08-03 ENCOUNTER — Ambulatory Visit (INDEPENDENT_AMBULATORY_CARE_PROVIDER_SITE_OTHER)

## 2021-08-03 ENCOUNTER — Ambulatory Visit
Admission: EM | Admit: 2021-08-03 | Discharge: 2021-08-03 | Disposition: A | Discharge status: Home / Self Care | Attending: Emergency Medicine | Admitting: Emergency Medicine

## 2021-08-03 ENCOUNTER — Encounter: Payer: Self-pay | Admitting: Emergency Medicine

## 2021-08-03 DIAGNOSIS — M25512 Pain in left shoulder: Secondary | ICD-10-CM

## 2021-08-03 DIAGNOSIS — S62002A Unspecified fracture of navicular [scaphoid] bone of left wrist, initial encounter for closed fracture: Secondary | ICD-10-CM

## 2021-08-03 DIAGNOSIS — M25522 Pain in left elbow: Secondary | ICD-10-CM

## 2021-08-03 DIAGNOSIS — W19XXXA Unspecified fall, initial encounter: Secondary | ICD-10-CM

## 2021-08-03 DIAGNOSIS — M25532 Pain in left wrist: Secondary | ICD-10-CM

## 2021-08-03 NOTE — ED Provider Notes (Signed)
?[facility] ? ? ? ?CSN: [id number] ?Arrival date & time: 08/03/21  1146 ? ? ?  ? ?History   ?Chief Complaint ?Chief Complaint  ?Patient presents with  ? Fall  ?  Fell Monday down stairs. Left shoulder elbow wrist pain. Intermittent numbness and swelling in hand. - Entered by patient  ? Shoulder Pain  ? Hand Problem  ? ? ?HPI ?Desiree Nunez is a 51 y.o. female.  Patient presents with left shoulder, elbow, and wrist pain following a fall on 07/31/2021.  She slipped and fell down 4 steps.  No head injury or loss of consciousness.  She reports intermittent numbness in her left fifth finger and intermittent swelling of her left hand since the fall.  She denies dizziness, headache, vision changes, chest pain, shortness of breath, abdominal pain, or other symptoms.  Patient had a video visit on 08/01/2021; diagnosed with acute pain of left shoulder; treated with Naprosyn and instructed to have x-rays done the next day.  Patient's medical history includes anemia, GERD, uterine fibroid, ovarian cyst. ? ?The history is provided by the patient and medical records.  ? ?Past Medical History:  ?Diagnosis Date  ? Anemia   ? GERD (gastroesophageal reflux disease)   ? Ovarian cyst   ? Uterine fibroid   ? ? ?Patient Active Problem List  ? Diagnosis Date Noted  ? Fibroids, intramural 09/10/2016  ? Symptomatic anemia 07/18/2016  ? Chest pain 07/18/2016  ? Tobacco abuse 07/18/2016  ? Hypokalemia 07/18/2016  ? Fibroids 07/18/2016  ? Epigastric pain 07/18/2016  ? ? ?Past Surgical History:  ?Procedure Laterality Date  ? IR ANGIOGRAM PELVIS SELECTIVE OR SUPRASELECTIVE  09/10/2016  ? IR ANGIOGRAM PELVIS SELECTIVE OR SUPRASELECTIVE  09/10/2016  ? IR ANGIOGRAM SELECTIVE EACH ADDITIONAL VESSEL  09/10/2016  ? IR ANGIOGRAM SELECTIVE EACH ADDITIONAL VESSEL  09/10/2016  ? IR EMBO TUMOR ORGAN ISCHEMIA INFARCT INC GUIDE ROADMAPPING  09/10/2016  ? IR RADIOLOGIST EVAL & MGMT  07/31/2016  ? IR RADIOLOGIST EVAL & MGMT  10/30/2016  ? IR US GUIDE VASC ACCESS  RIGHT  09/10/2016  ? LAPAROSCOPY    ? remove Ovarian Cyst   ? ? ?OB History   ?No obstetric history on file. ?  ? ? ? ?Home Medications   ? ?Prior to Admission medications   ?Medication Sig Start Date End Date Taking? Authorizing Provider  ?acetaminophen (TYLENOL) 500 MG tablet Take 1,000 mg by mouth every 6 (six) hours as needed for mild pain or moderate pain.    [provider]  ?cyclobenzaprine (FLEXERIL) 10 MG tablet Take 1 tablet (10 mg total) by mouth 2 (two) times daily as needed for muscle spasms. 02/26/20   Faustino Congress, NP  ?fluticasone (FLONASE) 50 MCG/ACT nasal spray Place 1 spray into both nostrils daily. 07/29/19   Loura Halt A, NP  ?ibuprofen (ADVIL) 800 MG tablet Take 1 tablet (800 mg total) by mouth every 8 (eight) hours as needed for moderate pain. 02/26/20   Faustino Congress, NP  ?indomethacin (INDOCIN) 50 MG capsule Take 1 capsule (50 mg total) by mouth 2 (two) times daily with a meal. 12/31/20   Scot Jun, FNP  ?iron polysaccharides (NIFEREX) 150 MG capsule Take 1 capsule (150 mg total) by mouth daily. 07/20/16   Rai, Vernelle Emerald, MD  ?medroxyPROGESTERone (PROVERA) 10 MG tablet Take 10 mg by mouth daily. 07/26/16   [provider]  ?pantoprazole (PROTONIX) 40 MG tablet Take 1 tablet (40 mg total) by mouth daily. 07/19/16  Rai, Ripudeep K, MD  ?senna-docusate (SENOKOT-S) 8.6-50 MG tablet Take 1 tablet by mouth at bedtime. 07/19/16   Mendel Corning, MD  ? ? ?Family History ?Family History  ?Problem Relation Age of Onset  ? CAD Father   ? Hypertension Sister   ? Diabetes Other   ? Cancer Neg Hx   ? ? ?Social History ?Social History  ? ?Tobacco Use  ? Smoking status: Former  ?  Packs/day: 0.50  ?  Years: 33.00  ?  Pack years: 16.50  ?  Types: Cigarettes  ?  Quit date: 02/2018  ?  Years since quitting: 3.4  ? Smokeless tobacco: Never  ?Vaping Use  ? Vaping Use: Former  ? Start date: 05/07/2014  ? Quit date: 07/06/2014  ?Substance Use Topics  ? Alcohol use: Yes  ?  Comment:  rare  ? Drug use: No  ? ? ? ?Allergies   ?Strawberry flavor ? ? ?Review of Systems ?Review of Systems  ?Eyes:  Negative for pain and visual disturbance.  ?Respiratory:  Negative for cough and shortness of breath.   ?Cardiovascular:  Negative for chest pain and palpitations.  ?Gastrointestinal:  Negative for abdominal pain, nausea and vomiting.  ?Musculoskeletal:  Positive for arthralgias and joint swelling. Negative for back pain and gait problem.  ?Skin:  Negative for color change, rash and wound.  ?Neurological:  Positive for numbness. Negative for dizziness, weakness and headaches.  ?All other systems reviewed and are negative. ? ? ?Physical Exam ?Triage Vital Signs ?ED Triage Vitals  ?Enc Vitals Group  ?   BP   ?   Pulse   ?   Resp   ?   Temp   ?   Temp src   ?   SpO2   ?   Weight   ?   Height   ?   Head Circumference   ?   Peak Flow   ?   Pain Score   ?   Pain Loc   ?   Pain Edu?   ?   Excl. in [place]?   ? ?No data found. ? ?Updated Vital Signs ?BP 137/74   Pulse 70   Temp 98.1 ?F (36.7 ?C)   Resp 18   SpO2 98%  ? ?Visual Acuity ?Right Eye Distance:   ?Left Eye Distance:   ?Bilateral Distance:   ? ?Right Eye Near:   ?Left Eye Near:    ?Bilateral Near:    ? ?Physical Exam ?Vitals and nursing note reviewed.  ?Constitutional:   ?   General: She is not in acute distress. ?   Appearance: She is well-developed. She is not ill-appearing.  ?HENT:  ?   Mouth/Throat:  ?   Mouth: Mucous membranes are moist.  ?Cardiovascular:  ?   Rate and Rhythm: Normal rate and regular rhythm.  ?   Heart sounds: Normal heart sounds.  ?Pulmonary:  ?   Effort: Pulmonary effort is normal. No respiratory distress.  ?   Breath sounds: Normal breath sounds.  ?Musculoskeletal:     ?   General: Tenderness present. No swelling or deformity.  ?     Arms: ? ?   Cervical back: Neck supple.  ?   Comments: LUE: 2+ pulses, sensation intact, strength 5/5. No erythema, ecchymosis, lesions, rash, or edema.   ?Skin: ?   General: Skin is warm and dry.  ?    Capillary Refill: Capillary refill takes less than 2 seconds.  ?   Findings:  No bruising, erythema, lesion or rash.  ?Neurological:  ?   General: No focal deficit present.  ?   Mental Status: She is alert and oriented to person, place, and time.  ?   Sensory: No sensory deficit.  ?   Motor: No weakness.  ?   Gait: Gait normal.  ?Psychiatric:     ?   Mood and Affect: Mood normal.     ?   Behavior: Behavior normal.  ? ? ? ?UC Treatments / Results  ?Labs ?(all labs ordered are listed, but only abnormal results are displayed) ?Labs Reviewed - No data to display ? ?EKG ? ? ?Radiology ?DG Elbow Complete Left ? ?Result Date: 08/03/2021 ?CLINICAL DATA:  Pain after fall 4 days ago. EXAM: LEFT ELBOW - COMPLETE 3+ VIEW COMPARISON:  None. FINDINGS: There is no evidence of fracture, dislocation, or joint effusion. There is no evidence of significant arthropathy or other focal bone abnormality. Soft tissues are unremarkable. IMPRESSION: Negative.  No acute osseous abnormality Electronically Signed   By: Dahlia Bailiff M.D.   On: 08/03/2021 12:39  ? ?DG Wrist Complete Left ? ?Result Date: 08/03/2021 ?CLINICAL DATA:  fall 4 days ago, intermittent hand pain with numbness and swelling since fall. EXAM: LEFT WRIST - COMPLETE 3+ VIEW COMPARISON:  None. FINDINGS: Cortical irregularity along the upper pole scaphoid. There is no evidence of arthropathy or other focal bone abnormality. Soft tissue swelling about the wrist. IMPRESSION: Cortical irregularity along the upper pole scaphoid, possibly reflecting a nondisplaced scaphoid fracture, recommend correlation for anatomic snuff box tenderness. Soft tissue swelling about the wrist. Electronically Signed   By: Dahlia Bailiff M.D.   On: 08/03/2021 12:39  ? ?DG Shoulder Left ? ?Result Date: 08/03/2021 ?CLINICAL DATA:  Pain after fall 4 days ago EXAM: LEFT SHOULDER - 2+ VIEW COMPARISON:  None. FINDINGS: There is no evidence of fracture or dislocation. There is no evidence of significant  arthropathy or other focal bone abnormality. Soft tissues are unremarkable. IMPRESSION: No acute osseous abnormality. Electronically Signed   By: Dahlia Bailiff M.D.   On: 08/03/2021 12:40   ? ?Procedures ?Procedu

## 2021-08-03 NOTE — Discharge Instructions (Addendum)
Continue taking the Naprosyn for discomfort.  Wear the wrist splint and use the sling as directed.  Follow-up with an orthopedist. ?

## 2021-08-03 NOTE — ED Triage Notes (Signed)
Pt here after fall 4 days ago. Presents with left shoulder, elbow and wrist pain with intermittent hand numbness and and swelling since.  ?

## 2021-08-04 ENCOUNTER — Other Ambulatory Visit: Payer: Self-pay | Admitting: Physician Assistant

## 2021-08-07 ENCOUNTER — Other Ambulatory Visit: Payer: Self-pay | Admitting: Physician Assistant

## 2021-08-07 DIAGNOSIS — S62015D Nondisplaced fracture of distal pole of navicular [scaphoid] bone of left wrist, subsequent encounter for fracture with routine healing: Secondary | ICD-10-CM

## 2021-08-21 ENCOUNTER — Other Ambulatory Visit: Payer: Self-pay | Admitting: Orthopedic Surgery

## 2021-08-21 ENCOUNTER — Ambulatory Visit
Admission: RE | Admit: 2021-08-21 | Discharge: 2021-08-21 | Disposition: A | Discharge status: Home / Self Care | Source: Ambulatory Visit | Attending: Physician Assistant | Admitting: Physician Assistant

## 2021-08-21 DIAGNOSIS — S62015D Nondisplaced fracture of distal pole of navicular [scaphoid] bone of left wrist, subsequent encounter for fracture with routine healing: Secondary | ICD-10-CM

## 2021-08-21 DIAGNOSIS — S46912A Strain of unspecified muscle, fascia and tendon at shoulder and upper arm level, left arm, initial encounter: Secondary | ICD-10-CM

## 2021-08-29 ENCOUNTER — Ambulatory Visit
Admission: RE | Admit: 2021-08-29 | Discharge: 2021-08-29 | Disposition: A | Discharge status: Home / Self Care | Source: Ambulatory Visit | Attending: Orthopedic Surgery | Admitting: Orthopedic Surgery

## 2021-08-29 DIAGNOSIS — S46912A Strain of unspecified muscle, fascia and tendon at shoulder and upper arm level, left arm, initial encounter: Secondary | ICD-10-CM | POA: Diagnosis present

## 2021-12-25 ENCOUNTER — Telehealth: Admitting: Physician Assistant

## 2021-12-25 DIAGNOSIS — J019 Acute sinusitis, unspecified: Secondary | ICD-10-CM

## 2021-12-25 DIAGNOSIS — B9689 Other specified bacterial agents as the cause of diseases classified elsewhere: Secondary | ICD-10-CM | POA: Diagnosis not present

## 2021-12-25 MED ORDER — AMOXICILLIN-POT CLAVULANATE 875-125 MG PO TABS: 1.0000 | ORAL_TABLET | Freq: Two times a day (BID) | ORAL | 0 refills | Status: DC

## 2021-12-25 NOTE — Patient Instructions (Signed)
Desiree Nunez, thank you for joining Mar Daring, PA-C for today's virtual visit.  While this provider is not your primary care provider (PCP), if your PCP is located in our provider database this encounter information will be shared with them immediately following your visit.  Consent: (Patient) Desiree Nunez provided verbal consent for this virtual visit at the beginning of the encounter.  Current Medications:  Current Outpatient Medications:    amoxicillin-clavulanate (AUGMENTIN) 875-125 MG tablet, Take 1 tablet by mouth 2 (two) times daily., Disp: 14 tablet, Rfl: 0   cyclobenzaprine (FLEXERIL) 10 MG tablet, Take 1 tablet (10 mg total) by mouth 2 (two) times daily as needed for muscle spasms., Disp: 20 tablet, Rfl: 0   fluticasone (FLONASE) 50 MCG/ACT nasal spray, Place 1 spray into both nostrils daily., Disp: 16 g, Rfl: 2   indomethacin (INDOCIN) 50 MG capsule, Take 1 capsule (50 mg total) by mouth 2 (two) times daily with a meal., Disp: 20 capsule, Rfl: 0   iron polysaccharides (NIFEREX) 150 MG capsule, Take 1 capsule (150 mg total) by mouth daily., Disp: 30 capsule, Rfl: 4   medroxyPROGESTERone (PROVERA) 10 MG tablet, Take 10 mg by mouth daily., Disp: , Rfl:    pantoprazole (PROTONIX) 40 MG tablet, Take 1 tablet (40 mg total) by mouth daily., Disp: 30 tablet, Rfl: 4   Medications ordered in this encounter:  Meds ordered this encounter  Medications   amoxicillin-clavulanate (AUGMENTIN) 875-125 MG tablet    Sig: Take 1 tablet by mouth 2 (two) times daily.    Dispense:  14 tablet    Refill:  0    Order Specific Question:   Supervising Provider    Answer:   Sabra Heck, BRIAN [[id number]]     *If you need refills on other medications prior to your next appointment, please contact your pharmacy*  Follow-Up: Call back or seek an in-person evaluation if the symptoms worsen or if the condition fails to improve as anticipated.  Other Instructions Sinus Infection, Adult A sinus  infection, also called sinusitis, is inflammation of your sinuses. Sinuses are hollow spaces in the bones around your face. Your sinuses are located: Around your eyes. In the middle of your forehead. Behind your nose. In your cheekbones. Mucus normally drains out of your sinuses. When your nasal tissues become inflamed or swollen, mucus can become trapped or blocked. This allows bacteria, viruses, and fungi to grow, which leads to infection. Most infections of the sinuses are caused by a virus. A sinus infection can develop quickly. It can last for up to 4 weeks (acute) or for more than 12 weeks (chronic). A sinus infection often develops after a cold. What are the causes? This condition is caused by anything that creates swelling in the sinuses or stops mucus from draining. This includes: Allergies. Asthma. Infection from bacteria or viruses. Deformities or blockages in your nose or sinuses. Abnormal growths in the nose (nasal polyps). Pollutants, such as chemicals or irritants in the air. Infection from fungi. This is rare. What increases the risk? You are more likely to develop this condition if you: Have a weak body defense system (immune system). Do a lot of swimming or diving. Overuse nasal sprays. Smoke. What are the signs or symptoms? The main symptoms of this condition are pain and a feeling of pressure around the affected sinuses. Other symptoms include: Stuffy nose or congestion that makes it difficult to breathe through your nose. Thick yellow or greenish drainage from your nose. Tenderness, swelling, and  warmth over the affected sinuses. A cough that may get worse at night. Decreased sense of smell and taste. Extra mucus that collects in the throat or the back of the nose (postnasal drip) causing a sore throat or bad breath. Tiredness (fatigue). Fever. How is this diagnosed? This condition is diagnosed based on: Your symptoms. Your medical history. A physical  exam. Tests to find out if your condition is acute or chronic. This may include: Checking your nose for nasal polyps. Viewing your sinuses using a device that has a light (endoscope). Testing for allergies or bacteria. Imaging tests, such as an MRI or CT scan. In rare cases, a bone biopsy may be done to rule out more serious types of fungal sinus disease. How is this treated? Treatment for a sinus infection depends on the cause and whether your condition is chronic or acute. If caused by a virus, your symptoms should go away on their own within 10 days. You may be given medicines to relieve symptoms. They include: Medicines that shrink swollen nasal passages (decongestants). A spray that eases inflammation of the nostrils (topical intranasal corticosteroids). Rinses that help get rid of thick mucus in your nose (nasal saline washes). Medicines that treat allergies (antihistamines). Over-the-counter pain relievers. If caused by bacteria, your health care provider may recommend waiting to see if your symptoms improve. Most bacterial infections will get better without antibiotic medicine. You may be given antibiotics if you have: A severe infection. A weak immune system. If caused by narrow nasal passages or nasal polyps, surgery may be needed. Follow these instructions at home: Medicines Take, use, or apply over-the-counter and prescription medicines only as told by your health care provider. These may include nasal sprays. If you were prescribed an antibiotic medicine, take it as told by your health care provider. Do not stop taking the antibiotic even if you start to feel better. Hydrate and humidify  Drink enough fluid to keep your urine pale yellow. Staying hydrated will help to thin your mucus. Use a cool mist humidifier to keep the humidity level in your home above 50%. Inhale steam for 10-15 minutes, 3-4 times a day, or as told by your health care provider. You can do this in the  bathroom while a hot shower is running. Limit your exposure to cool or dry air. Rest Rest as much as possible. Sleep with your head raised (elevated). Make sure you get enough sleep each night. General instructions  Apply a warm, moist washcloth to your face 3-4 times a day or as told by your health care provider. This will help with discomfort. Use nasal saline washes as often as told by your health care provider. Wash your hands often with soap and water to reduce your exposure to germs. If soap and water are not available, use hand sanitizer. Do not smoke. Avoid being around people who are smoking (secondhand smoke). Keep all follow-up visits. This is important. Contact a health care provider if: You have a fever. Your symptoms get worse. Your symptoms do not improve within 10 days. Get help right away if: You have a severe headache. You have persistent vomiting. You have severe pain or swelling around your face or eyes. You have vision problems. You develop confusion. Your neck is stiff. You have trouble breathing. These symptoms may be an emergency. Get help right away. Call 911. Do not wait to see if the symptoms will go away. Do not drive yourself to the hospital. Summary A sinus infection is  soreness and inflammation of your sinuses. Sinuses are hollow spaces in the bones around your face. This condition is caused by nasal tissues that become inflamed or swollen. The swelling traps or blocks the flow of mucus. This allows bacteria, viruses, and fungi to grow, which leads to infection. If you were prescribed an antibiotic medicine, take it as told by your health care provider. Do not stop taking the antibiotic even if you start to feel better. Keep all follow-up visits. This is important. This information is not intended to replace advice given to you by your health care provider. Make sure you discuss any questions you have with your health care provider. Document Revised:  03/28/2021 Document Reviewed: 03/28/2021 Elsevier Patient Education  [place].    If you have been instructed to have an in-person evaluation today at a local Urgent Care facility, please use the link below. It will take you to a list of all of our available [facility], including address, phone number and hours of operation. Please do not delay care.  [facility]  If you or a family member do not have a primary care provider, use the link below to schedule a visit and establish care. When you choose a [facility] primary care physician or advanced practice provider, you gain a long-term partner in health. Find a Primary Care Provider  Learn more about [facility]'s in-office and virtual care options: [facility]

## 2021-12-25 NOTE — Progress Notes (Signed)
Virtual Visit Consent   Desiree Nunez, you are scheduled for a virtual visit with a [facility] provider today. Just as with appointments in the office, your consent must be obtained to participate. Your consent will be active for this visit and any virtual visit you may have with one of our providers in the next 365 days. If you have a MyChart account, a copy of this consent can be sent to you electronically.  As this is a virtual visit, video technology does not allow for your provider to perform a traditional examination. This may limit your provider's ability to fully assess your condition. If your provider identifies any concerns that need to be evaluated in person or the need to arrange testing (such as labs, EKG, etc.), we will make arrangements to do so. Although advances in technology are sophisticated, we cannot ensure that it will always work on either your end or our end. If the connection with a video visit is poor, the visit may have to be switched to a telephone visit. With either a video or telephone visit, we are not always able to ensure that we have a secure connection.  By engaging in this virtual visit, you consent to the provision of healthcare and authorize for your insurance to be billed (if applicable) for the services provided during this visit. Depending on your insurance coverage, you may receive a charge related to this service.  I need to obtain your verbal consent now. Are you willing to proceed with your visit today? Desiree Nunez has provided verbal consent on 12/25/2021 for a virtual visit (video or telephone). Mar Daring, PA-C  Date: 12/25/2021 4:03 PM  Virtual Visit via Video Note   I, Mar Daring, connected with  Desiree Nunez  ([id number], 1970-10-27) on 12/25/21 at  4:00 PM EDT by a video-enabled telemedicine application and verified that I am speaking with the correct person using two identifiers.  Location: Patient: Virtual Visit Location  Patient: Home Provider: Virtual Visit Location Provider: Home Office   I discussed the limitations of evaluation and management by telemedicine and the availability of in person appointments. The patient expressed understanding and agreed to proceed.    History of Present Illness: Desiree Nunez is a 51 y.o. who identifies as a female who was assigned female at birth, and is being seen today for sinusitis.  HPI: Sinusitis This is a new problem. The current episode started 1 to 4 weeks ago. The problem has been gradually worsening since onset. There has been no fever. She is experiencing no pain. Associated symptoms include congestion, ear pain, headaches, shortness of breath and sinus pressure. Pertinent negatives include no chills, sneezing or sore throat. (Rhinorrhea, post nasal drainage, fatigue, light sensitvity) Past treatments include nasal decongestants and saline nose sprays (zyrtec, flonase). The treatment provided no relief.     Problems:  Patient Active Problem List   Diagnosis Date Noted   Fibroids, intramural 09/10/2016   Symptomatic anemia 07/18/2016   Chest pain 07/18/2016   Tobacco abuse 07/18/2016   Hypokalemia 07/18/2016   Fibroids 07/18/2016   Epigastric pain 07/18/2016    Allergies:  Allergies  Allergen Reactions   Strawberry Flavor Swelling   Medications:  Current Outpatient Medications:    amoxicillin-clavulanate (AUGMENTIN) 875-125 MG tablet, Take 1 tablet by mouth 2 (two) times daily., Disp: 14 tablet, Rfl: 0   cyclobenzaprine (FLEXERIL) 10 MG tablet, Take 1 tablet (10 mg total) by mouth 2 (two) times daily as needed for muscle spasms.,  Disp: 20 tablet, Rfl: 0   fluticasone (FLONASE) 50 MCG/ACT nasal spray, Place 1 spray into both nostrils daily., Disp: 16 g, Rfl: 2   indomethacin (INDOCIN) 50 MG capsule, Take 1 capsule (50 mg total) by mouth 2 (two) times daily with a meal., Disp: 20 capsule, Rfl: 0   iron polysaccharides (NIFEREX) 150 MG capsule, Take 1  capsule (150 mg total) by mouth daily., Disp: 30 capsule, Rfl: 4   medroxyPROGESTERone (PROVERA) 10 MG tablet, Take 10 mg by mouth daily., Disp: , Rfl:    pantoprazole (PROTONIX) 40 MG tablet, Take 1 tablet (40 mg total) by mouth daily., Disp: 30 tablet, Rfl: 4  Observations/Objective: Patient is well-developed, well-nourished in no acute distress.  Resting comfortably at home.  Head is normocephalic, atraumatic.  No labored breathing.  Speech is clear and coherent with logical content.  Patient is alert and oriented at baseline.  Congested sounding  Assessment and Plan: 1. Acute bacterial sinusitis - amoxicillin-clavulanate (AUGMENTIN) 875-125 MG tablet; Take 1 tablet by mouth 2 (two) times daily.  Dispense: 14 tablet; Refill: 0  - Worsening symptoms that have not responded to OTC medications.  - Will give Augmentin - Continue allergy medications.  - Steam and humidifier can help - Stay well hydrated and get plenty of rest.  - Seek in person evaluation if no symptom improvement or if symptoms worsen   Follow Up Instructions: I discussed the assessment and treatment plan with the patient. The patient was provided an opportunity to ask questions and all were answered. The patient agreed with the plan and demonstrated an understanding of the instructions.  A copy of instructions were sent to the patient via MyChart unless otherwise noted below.    The patient was advised to call back or seek an in-person evaluation if the symptoms worsen or if the condition fails to improve as anticipated.  Time:  I spent 8 minutes with the patient via telehealth technology discussing the above problems/concerns.    Mar Daring, PA-C

## 2022-02-26 ENCOUNTER — Ambulatory Visit
Admission: RE | Admit: 2022-02-26 | Discharge: 2022-02-26 | Disposition: A | Discharge status: Home / Self Care | Source: Ambulatory Visit

## 2022-02-26 VITALS — BP 131/81 | HR 60 | Temp 98.2°F | Resp 15

## 2022-02-26 DIAGNOSIS — M25579 Pain in unspecified ankle and joints of unspecified foot: Secondary | ICD-10-CM | POA: Insufficient documentation

## 2022-02-26 DIAGNOSIS — M25561 Pain in right knee: Secondary | ICD-10-CM

## 2022-02-26 DIAGNOSIS — F17200 Nicotine dependence, unspecified, uncomplicated: Secondary | ICD-10-CM | POA: Insufficient documentation

## 2022-02-26 DIAGNOSIS — N83209 Unspecified ovarian cyst, unspecified side: Secondary | ICD-10-CM | POA: Insufficient documentation

## 2022-02-26 DIAGNOSIS — T7421XA Adult sexual abuse, confirmed, initial encounter: Secondary | ICD-10-CM | POA: Insufficient documentation

## 2022-02-26 DIAGNOSIS — G4730 Sleep apnea, unspecified: Secondary | ICD-10-CM | POA: Insufficient documentation

## 2022-02-26 DIAGNOSIS — E669 Obesity, unspecified: Secondary | ICD-10-CM | POA: Insufficient documentation

## 2022-02-26 DIAGNOSIS — R0683 Snoring: Secondary | ICD-10-CM | POA: Insufficient documentation

## 2022-02-26 DIAGNOSIS — M7070 Other bursitis of hip, unspecified hip: Secondary | ICD-10-CM | POA: Insufficient documentation

## 2022-02-26 DIAGNOSIS — N971 Female infertility of tubal origin: Secondary | ICD-10-CM | POA: Insufficient documentation
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
## 2022-02-26 NOTE — Discharge Instructions (Addendum)
I recommend evaluation by orthopedic urgent care.

## 2022-06-07 ENCOUNTER — Emergency Department

## 2022-06-07 ENCOUNTER — Emergency Department
Admission: EM | Admit: 2022-06-07 | Discharge: 2022-06-07 | Disposition: A | Discharge status: Home / Self Care | Attending: Emergency Medicine | Admitting: Emergency Medicine

## 2022-06-07 ENCOUNTER — Other Ambulatory Visit: Payer: Self-pay

## 2022-06-07 DIAGNOSIS — R0789 Other chest pain: Secondary | ICD-10-CM | POA: Diagnosis not present

## 2022-06-07 DIAGNOSIS — R079 Chest pain, unspecified: Secondary | ICD-10-CM | POA: Diagnosis present

## 2022-06-07 LAB — CBC
HCT: 44.6 % (ref 36.0–46.0)
Hemoglobin: 14.7 g/dL (ref 12.0–15.0)
MCH: 30.7 pg (ref 26.0–34.0)
MCHC: 33 g/dL (ref 30.0–36.0)
MCV: 93.1 fL (ref 80.0–100.0)
Platelets: 382 10*3/uL (ref 150–400)
RBC: 4.79 MIL/uL (ref 3.87–5.11)
RDW: 12.9 % (ref 11.5–15.5)
WBC: 7.4 10*3/uL (ref 4.0–10.5)
nRBC: 0 % (ref 0.0–0.2)

## 2022-06-07 LAB — BASIC METABOLIC PANEL
Anion gap: 13 (ref 5–15)
BUN: 10 mg/dL (ref 6–20)
CO2: 21 mmol/L — ABNORMAL LOW (ref 22–32)
Calcium: 9.8 mg/dL (ref 8.9–10.3)
Chloride: 106 mmol/L (ref 98–111)
Creatinine, Ser: 0.95 mg/dL (ref 0.44–1.00)
GFR, Estimated: >60 mL/min (ref >60)
Glucose, Bld: 123 mg/dL — ABNORMAL HIGH (ref 70–99)
Potassium: 3.8 mmol/L (ref 3.5–5.1)
Sodium: 140 mmol/L (ref 135–145)

## 2022-06-07 LAB — TROPONIN I (HIGH SENSITIVITY)
Troponin I (High Sensitivity): 3 ng/L (ref <18)
Troponin I (High Sensitivity): 3 ng/L (ref <18)

## 2022-06-07 MED ORDER — KETOROLAC TROMETHAMINE 30 MG/ML IJ SOLN
30.0000 mg | Freq: Once | INTRAMUSCULAR | Status: AC
  Administered 2022-06-07: 30 mg via INTRAVENOUS
  Filled 2022-06-07: qty 1

## 2022-06-07 MED ORDER — IOHEXOL 350 MG/ML SOLN
75.0000 mL | Freq: Once | INTRAVENOUS | Status: AC | PRN
  Administered 2022-06-07: 75 mL via INTRAVENOUS

## 2022-06-07 NOTE — ED Notes (Signed)
Patient transported to X-ray 

## 2022-06-07 NOTE — ED Provider Notes (Signed)
[facility] Provider Note    Event Date/Time   First MD Initiated Contact with Patient 06/07/22 1538     (approximate)   History   Chest Pain   HPI  Desiree Nunez is a 52 y.o. female with a history of anemia, GERD who presents with complaints of left-sided chest pain.  Patient reports pain started yesterday evening while she was walking down the steps.  She did feel short of breath at that time.  Symptoms gradually improved but recurred again this morning when she got up for work.  She reports the area is tender to touch.  No rash.  No injury to the area.  Did have a strange feeling in her right leg last week.  No history of blood clots.  No fevers     Physical Exam   Triage Vital Signs: ED Triage Vitals  Enc Vitals Group     BP 06/07/22 1505 (!) 159/86     Pulse Rate 06/07/22 1505 73     Resp 06/07/22 1505 20     Temp 06/07/22 1505 98 F (36.7 C)     Temp Source 06/07/22 1505 Oral     SpO2 06/07/22 1505 95 %     Weight 06/07/22 1503 106.1 kg (234 lb)     Height 06/07/22 1503 1.753 m ('5\' 9"'$ )     Head Circumference --      Peak Flow --      Pain Score 06/07/22 1503 10     Pain Loc --      Pain Edu? --      Excl. in [place]? --     Most recent vital signs: Vitals:   06/07/22 1505 06/07/22 1604  BP: (!) 159/86 (!) 155/82  Pulse: 73 65  Resp: 20 17  Temp: 98 F (36.7 C)   SpO2: 95% 97%     General: Awake, no distress.  CV:  Good peripheral perfusion.  Left anterior chest wall tenderness, regular rate and rhythm resp:  Normal effort.  Clear to auscultation bilaterally Abd:  No distention.  Other:  No calf pain or swelling   ED Results / Procedures / Treatments   Labs (all labs ordered are listed, but only abnormal results are displayed) Labs Reviewed  BASIC METABOLIC PANEL - Abnormal; Notable for the following components:      Result Value   CO2 21 (*)    Glucose, Bld 123 (*)    All other components within normal limits  CBC   TROPONIN I (HIGH SENSITIVITY)  TROPONIN I (HIGH SENSITIVITY)     EKG  ED ECG REPORT I, Lavonia Drafts, the attending physician, personally viewed and interpreted this ECG.  Date: 06/07/2022  Rhythm: normal sinus rhythm QRS Axis: normal Intervals: normal ST/T Wave abnormalities: normal Narrative Interpretation: no evidence of acute ischemia    RADIOLOGY Chest x-ray viewed interpreted by me, no pneumonia    PROCEDURES:  Critical Care performed:   Procedures   MEDICATIONS ORDERED IN ED: Medications  ketorolac (TORADOL) 30 MG/ML injection 30 mg (30 mg Intravenous Given 06/07/22 1653)  iohexol (OMNIPAQUE) 350 MG/ML injection 75 mL (75 mLs Intravenous Contrast Given 06/07/22 1751)     IMPRESSION / MDM / ASSESSMENT AND PLAN / ED COURSE  I reviewed the triage vital signs and the nursing notes. Patient's presentation is most consistent with acute presentation with potential threat to life or bodily function.  Patient presents with chest pain as detailed above.  Differential includes ACS,  PE, musculoskeletal chest wall pain, less likely pneumonia/pneumothorax  Overall well-appearing and in no acute distress.  EKG, high sensitive troponin are reassuring, doubt ACS  No evidence of pneumothorax or pneumonia  Lab work reviewed and overall reassuring.  Will send for CT angiography to rule out PE   CT scan is negative for PE, second troponin is normal, considered however patient request discharge, she will follow-up with cardiology, she knows to return if any worsening or change in symptoms       FINAL CLINICAL IMPRESSION(S) / ED DIAGNOSES   Final diagnoses:  Atypical chest pain     Rx / DC Orders   ED Discharge Orders          Ordered    Ambulatory referral to Cardiology       Comments: If you have not heard from the Cardiology office within the next 72 hours please call [phone].   06/07/22 1826             Note:  This document was prepared  using Dragon voice recognition software and may include unintentional dictation errors.   Lavonia Drafts, MD 06/07/22 [phone]

## 2022-06-07 NOTE — ED Notes (Signed)
Pt verbalizes understanding of discharge instructions. Opportunity for questioning and answers were provided. Pt discharged from ED to home with husband.    

## 2022-06-07 NOTE — ED Triage Notes (Signed)
Pt with c/o pain in chest, back, left side and arm that started last night. Pt said it eased after taking simethicone but pain returned and was worse today. Pt states she has increasing pain and SOB.

## 2022-06-14 ENCOUNTER — Encounter: Payer: Self-pay | Admitting: *Deleted

## 2022-06-15 ENCOUNTER — Other Ambulatory Visit: Payer: Self-pay

## 2022-06-15 ENCOUNTER — Encounter: Payer: Self-pay | Admitting: Internal Medicine

## 2022-06-15 ENCOUNTER — Ambulatory Visit: Attending: Internal Medicine | Admitting: Internal Medicine

## 2022-06-15 ENCOUNTER — Other Ambulatory Visit: Payer: Self-pay | Admitting: Medical

## 2022-06-15 ENCOUNTER — Encounter
Admission: AD | Disposition: A | Discharge status: Home / Self Care | Payer: Self-pay | Source: Ambulatory Visit | Attending: Internal Medicine

## 2022-06-15 ENCOUNTER — Ambulatory Visit
Admission: AD | Admit: 2022-06-15 | Discharge: 2022-06-15 | Disposition: A | Discharge status: Home / Self Care | Source: Ambulatory Visit | Attending: Internal Medicine | Admitting: Internal Medicine

## 2022-06-15 VITALS — BP 140/80 | HR 53 | Ht 69.0 in | Wt 235.0 lb

## 2022-06-15 DIAGNOSIS — R0601 Orthopnea: Secondary | ICD-10-CM | POA: Diagnosis not present

## 2022-06-15 DIAGNOSIS — K219 Gastro-esophageal reflux disease without esophagitis: Secondary | ICD-10-CM | POA: Insufficient documentation

## 2022-06-15 DIAGNOSIS — I200 Unstable angina: Secondary | ICD-10-CM | POA: Diagnosis present

## 2022-06-15 DIAGNOSIS — G4733 Obstructive sleep apnea (adult) (pediatric): Secondary | ICD-10-CM | POA: Insufficient documentation

## 2022-06-15 DIAGNOSIS — R079 Chest pain, unspecified: Secondary | ICD-10-CM

## 2022-06-15 DIAGNOSIS — R0602 Shortness of breath: Secondary | ICD-10-CM

## 2022-06-15 DIAGNOSIS — R011 Cardiac murmur, unspecified: Secondary | ICD-10-CM | POA: Diagnosis not present

## 2022-06-15 DIAGNOSIS — Z87891 Personal history of nicotine dependence: Secondary | ICD-10-CM | POA: Diagnosis not present

## 2022-06-15 DIAGNOSIS — Z8711 Personal history of peptic ulcer disease: Secondary | ICD-10-CM | POA: Diagnosis not present

## 2022-06-15 HISTORY — PX: LEFT HEART CATH AND CORONARY ANGIOGRAPHY: CATH118249

## 2022-06-15 LAB — CBC WITH DIFFERENTIAL/PLATELET
Abs Immature Granulocytes: 0.01 10*3/uL (ref 0.00–0.07)
Basophils Absolute: 0 10*3/uL (ref 0.0–0.1)
Basophils Relative: 1 %
Eosinophils Absolute: 0.2 10*3/uL (ref 0.0–0.5)
Eosinophils Relative: 3 %
HCT: 41.8 % (ref 36.0–46.0)
Hemoglobin: 13.9 g/dL (ref 12.0–15.0)
Immature Granulocytes: 0 %
Lymphocytes Relative: 51 %
Lymphs Abs: 2.8 10*3/uL (ref 0.7–4.0)
MCH: 30.2 pg (ref 26.0–34.0)
MCHC: 33.3 g/dL (ref 30.0–36.0)
MCV: 90.9 fL (ref 80.0–100.0)
Monocytes Absolute: 0.5 10*3/uL (ref 0.1–1.0)
Monocytes Relative: 9 %
Neutro Abs: 1.9 10*3/uL (ref 1.7–7.7)
Neutrophils Relative %: 36 %
Platelets: 370 10*3/uL (ref 150–400)
RBC: 4.6 MIL/uL (ref 3.87–5.11)
RDW: 13 % (ref 11.5–15.5)
WBC: 5.4 10*3/uL (ref 4.0–10.5)
nRBC: 0 % (ref 0.0–0.2)

## 2022-06-15 LAB — BASIC METABOLIC PANEL
Anion gap: 7 (ref 5–15)
BUN: 10 mg/dL (ref 6–20)
CO2: 26 mmol/L (ref 22–32)
Calcium: 9.1 mg/dL (ref 8.9–10.3)
Chloride: 102 mmol/L (ref 98–111)
Creatinine, Ser: 0.89 mg/dL (ref 0.44–1.00)
GFR, Estimated: >60 mL/min (ref >60)
Glucose, Bld: 83 mg/dL (ref 70–99)
Potassium: 3.4 mmol/L — ABNORMAL LOW (ref 3.5–5.1)
Sodium: 135 mmol/L (ref 135–145)

## 2022-06-15 SURGERY — LEFT HEART CATH AND CORONARY ANGIOGRAPHY
Anesthesia: Moderate Sedation | Laterality: Left

## 2022-06-15 MED ORDER — SODIUM CHLORIDE 0.9 % IV SOLN: 250.0000 mL | INTRAVENOUS | Status: DC | PRN

## 2022-06-15 MED ORDER — SODIUM CHLORIDE 0.9% FLUSH: 3.0000 mL | Freq: Two times a day (BID) | INTRAVENOUS | Status: DC

## 2022-06-15 MED ORDER — SODIUM CHLORIDE 0.9 % WEIGHT BASED INFUSION
3.0000 mL/kg/h | INTRAVENOUS | Status: AC
  Administered 2022-06-15: 3 mL/kg/h via INTRAVENOUS

## 2022-06-15 MED ORDER — NITROGLYCERIN 0.4 MG SL SUBL: 0.4000 mg | SUBLINGUAL_TABLET | SUBLINGUAL | Status: DC | PRN

## 2022-06-15 MED ORDER — MIDAZOLAM HCL 2 MG/2ML IJ SOLN
INTRAMUSCULAR | Status: AC
  Filled 2022-06-15: qty 2

## 2022-06-15 MED ORDER — FENTANYL CITRATE (PF) 100 MCG/2ML IJ SOLN
INTRAMUSCULAR | Status: DC | PRN
  Administered 2022-06-15 (×2): 25 ug via INTRAVENOUS

## 2022-06-15 MED ORDER — IOHEXOL 300 MG/ML  SOLN
INTRAMUSCULAR | Status: DC | PRN
  Administered 2022-06-15: 47 mL

## 2022-06-15 MED ORDER — ACETAMINOPHEN 325 MG PO TABS: 650.0000 mg | ORAL_TABLET | ORAL | Status: DC | PRN

## 2022-06-15 MED ORDER — SODIUM CHLORIDE 0.9% FLUSH: 3.0000 mL | INTRAVENOUS | Status: DC | PRN

## 2022-06-15 MED ORDER — SODIUM CHLORIDE 0.9 % IV SOLN: INTRAVENOUS | Status: DC

## 2022-06-15 MED ORDER — MIDAZOLAM HCL 2 MG/2ML IJ SOLN
INTRAMUSCULAR | Status: DC | PRN
  Administered 2022-06-15 (×2): 1 mg via INTRAVENOUS

## 2022-06-15 MED ORDER — ASPIRIN 81 MG PO CHEW: 81.0000 mg | CHEWABLE_TABLET | ORAL | Status: DC

## 2022-06-15 MED ORDER — HEPARIN (PORCINE) IN NACL 1000-0.9 UT/500ML-% IV SOLN
INTRAVENOUS | Status: DC | PRN
  Administered 2022-06-15: 1000 mL

## 2022-06-15 MED ORDER — VERAPAMIL HCL 2.5 MG/ML IV SOLN
INTRAVENOUS | Status: DC | PRN
  Administered 2022-06-15: 2.5 mg via INTRA_ARTERIAL

## 2022-06-15 MED ORDER — NITROGLYCERIN 0.4 MG SL SUBL
0.4000 mg | SUBLINGUAL_TABLET | SUBLINGUAL | Status: AC | PRN
  Administered 2022-06-15 (×2): 0.4 mg via SUBLINGUAL

## 2022-06-15 MED ORDER — HEPARIN (PORCINE) IN NACL 1000-0.9 UT/500ML-% IV SOLN
INTRAVENOUS | Status: AC
  Filled 2022-06-15: qty 1000

## 2022-06-15 MED ORDER — VERAPAMIL HCL 2.5 MG/ML IV SOLN
INTRAVENOUS | Status: AC
  Filled 2022-06-15: qty 2

## 2022-06-15 MED ORDER — HEPARIN SODIUM (PORCINE) 1000 UNIT/ML IJ SOLN
INTRAMUSCULAR | Status: DC | PRN
  Administered 2022-06-15: 5000 [IU] via INTRAVENOUS

## 2022-06-15 MED ORDER — SODIUM CHLORIDE 0.9 % WEIGHT BASED INFUSION
1.0000 mL/kg/h | INTRAVENOUS | Status: DC
  Administered 2022-06-15: 1 mL/kg/h via INTRAVENOUS

## 2022-06-15 MED ORDER — FENTANYL CITRATE (PF) 100 MCG/2ML IJ SOLN
INTRAMUSCULAR | Status: AC
  Filled 2022-06-15: qty 2

## 2022-06-15 MED ORDER — ASPIRIN 81 MG PO CHEW
324.0000 mg | CHEWABLE_TABLET | Freq: Once | ORAL | Status: AC
  Administered 2022-06-15: 324 mg via ORAL

## 2022-06-15 MED ORDER — AMLODIPINE BESYLATE 2.5 MG PO TABS: 2.5000 mg | ORAL_TABLET | Freq: Every day | ORAL | 11 refills | Status: AC

## 2022-06-15 MED ORDER — LABETALOL HCL 5 MG/ML IV SOLN: 10.0000 mg | INTRAVENOUS | Status: DC | PRN

## 2022-06-15 MED ORDER — NITROGLYCERIN 0.4 MG SL SUBL: 0.4000 mg | SUBLINGUAL_TABLET | SUBLINGUAL | 99 refills | Status: AC | PRN

## 2022-06-15 MED ORDER — ONDANSETRON HCL 4 MG/2ML IJ SOLN: 4.0000 mg | Freq: Four times a day (QID) | INTRAMUSCULAR | Status: DC | PRN

## 2022-06-15 MED ORDER — HEPARIN SODIUM (PORCINE) 1000 UNIT/ML IJ SOLN
INTRAMUSCULAR | Status: AC
  Filled 2022-06-15: qty 10

## 2022-06-15 MED ORDER — NITROGLYCERIN 0.4 MG SL SUBL
SUBLINGUAL_TABLET | SUBLINGUAL | Status: AC
  Administered 2022-06-15: 0.4 mg via SUBLINGUAL
  Filled 2022-06-15: qty 1

## 2022-06-15 MED ORDER — HYDRALAZINE HCL 20 MG/ML IJ SOLN: 10.0000 mg | INTRAMUSCULAR | Status: DC | PRN

## 2022-06-15 SURGICAL SUPPLY — 10 items
BAND ZEPHYR COMPRESS 30 LONG (HEMOSTASIS) IMPLANT
CATH 5FR JL3.5 JR4 ANG PIG MP (CATHETERS) IMPLANT
DRAPE BRACHIAL (DRAPES) IMPLANT
GLIDESHEATH SLEND SS 6F .021 (SHEATH) IMPLANT
GUIDEWIRE INQWIRE 1.5J.035X260 (WIRE) IMPLANT
INQWIRE 1.5J .035X260CM (WIRE) ×1
PACK CARDIAC CATH (CUSTOM PROCEDURE TRAY) ×1 IMPLANT
PROTECTION STATION PRESSURIZED (MISCELLANEOUS) ×1
SET ATX SIMPLICITY (MISCELLANEOUS) IMPLANT
STATION PROTECTION PRESSURIZED (MISCELLANEOUS) IMPLANT

## 2022-06-15 NOTE — H&P (View-Only) (Signed)
New Outpatient Visit Date: 06/15/2022  Referring Provider: Lavonia Drafts, MD [facility] Emergency Department  Chief Complaint: Chest pain  HPI:  Desiree Nunez is a 52 y.o. female who is being seen today for the evaluation of chest pain at the request of Dr. Corky Downs. She has a history of GERD, anemia, peptic ulcer disease, uterine fibroid, OSA, and ovarian cyst.  She presented to the [facility] emergency department on 06/07/2022 complaining of chest pain that initially began the day before while walking down the stairs.  Pain gradually resolved but recurred the next morning.  ED workup was largely unrevealing other than EKG showing possible anterior infarct.  Ms. Desiree Nunez presents today with waxing and waning chest pain that began the last week in January and has been present ever since.  It is typically 6-7/10 in intensity but escalates to 10/10 in intensity whenever she walks.  When it first began, she had only walked a few feet when she had sudden onset of severe chest pain and associated dizziness.  She notes that the pain gets better but never resolves when she sits down to rest.  She has tried taking acetaminophen and several NSAIDs without significant relief.  She also tried simethicone without improvement.  She notes having had a similar episode of pain in 02/2022, though it subsided on its own after 1-2 days.  She also reports orthopnea over the last 2 weeks, now sleeping on 3-4 pillows.  Ms. Desiree Nunez reports that she was born with a heart murmur but has never undergone cardiac evaluation.  She also denies a history of lung problems.  Notably, she gets short of breath with minimal activity and finds herself tripoding and breathing with pursed lips from time to time; this began around the same time as her chest pain last month.  She is a former smoker, having quit around 2018 or 2019.  She has not noticed any wheezing.  There have been no obvious precipitants for her recent chest pain  or shortness of breath other than some increase in her baseline stress.  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  --------------------------------------------------------------------------------------------------  Cardiovascular History & Procedures: Cardiovascular Problems: Atypical chest pain  Risk Factors: Obesity, family history, and prior tobacco use  Cath/PCI: None  CV Surgery: None  EP Procedures and Devices: None  Non-Invasive Evaluation(s): None  Recent CV Pertinent Labs: Lab Results  Component Value Date   CHOL 139 07/19/2016   HDL 41 07/19/2016   LDLCALC 80 07/19/2016   TRIG 89 07/19/2016   CHOLHDL 3.4 07/19/2016   INR 0.95 09/10/2016   K 3.8 06/07/2022   MG 2.2 07/19/2016   BUN 10 06/07/2022   CREATININE 0.95 06/07/2022    --------------------------------------------------------------------------------------------------  Past Medical History:  Diagnosis Date   Anemia    GERD (gastroesophageal reflux disease)    Ovarian cyst    Uterine fibroid     Past Surgical History:  Procedure Laterality Date   IR ANGIOGRAM PELVIS SELECTIVE OR SUPRASELECTIVE  09/10/2016   IR ANGIOGRAM PELVIS SELECTIVE OR SUPRASELECTIVE  09/10/2016   IR ANGIOGRAM SELECTIVE EACH ADDITIONAL VESSEL  09/10/2016   IR ANGIOGRAM SELECTIVE EACH ADDITIONAL VESSEL  09/10/2016   IR EMBO TUMOR ORGAN ISCHEMIA INFARCT INC GUIDE ROADMAPPING  09/10/2016   IR RADIOLOGIST EVAL & MGMT  07/31/2016   IR RADIOLOGIST EVAL & MGMT  10/30/2016   IR US GUIDE VASC ACCESS RIGHT  09/10/2016   LAPAROSCOPY  remove Ovarian Cyst     Current Meds  Medication Sig   acetaminophen (TYLENOL) 325 MG tablet Take 650 mg by mouth as needed.   buPROPion HCl (WELLBUTRIN PO) Take 150 mg by mouth daily.   celecoxib (CELEBREX) 200 MG capsule Take 200 mg by mouth daily as needed.   ibuprofen (ADVIL) 200 MG tablet Take 200 mg by mouth as needed.    Allergies: Strawberry  flavor  Social History   Tobacco Use   Smoking status: Former    Packs/day: 0.50    Years: 33.00    Total pack years: 16.50    Types: Cigarettes    Quit date: 02/2018    Years since quitting: 4.3   Smokeless tobacco: Never  Vaping Use   Vaping Use: Former   Start date: 05/07/2014   Quit date: 07/06/2014  Substance Use Topics   Alcohol use: Yes    Comment: 1-2 drinks/year   Drug use: No    Family History  Problem Relation Age of Onset   CAD Father    Heart attack Father 52   Stroke Father    Diabetes Sister    Hypertension Sister    Pulmonary embolism Sister    Fibroids Sister    Diabetes Other    Cancer Neg Hx     Review of Systems: A 12-system review of systems was performed and was negative except as noted in the HPI.  --------------------------------------------------------------------------------------------------  Physical Exam: BP (!) 140/80 (BP Location: Right Arm, Patient Position: Sitting, Cuff Size: Large)   Pulse (!) 53   Ht 5' 9"$  (1.753 m)   Wt 235 lb (106.6 kg)   SpO2 99%   BMI 34.70 kg/m   General: Initially comfortable appearing woman, though at the [place] of our visit, she was in visible pain. HEENT: No conjunctival pallor or scleral icterus. Neck: Supple without lymphadenopathy, thyromegaly, JVD, or HJR. No carotid bruit. Lungs: Normal work of breathing. Clear to auscultation bilaterally without wheezes or crackles. Heart: Regular rate and rhythm with 2/6 holosystolic murmur.  No rubs or gallops. Abd: Bowel sounds present. Soft, NT/ND without hepatosplenomegaly Ext: No lower extremity edema. Radial, PT, and DP pulses are 2+ bilaterally Skin: Warm and dry without rash. Neuro: CNIII-XII intact. Strength and fine-touch sensation intact in upper and lower extremities bilaterally. Psych: Normal mood and affect.  EKG: Normal sinus rhythm with possible anterior infarct.  Lab Results  Component Value Date   WBC 7.4 06/07/2022   HGB 14.7 06/07/2022    HCT 44.6 06/07/2022   MCV 93.1 06/07/2022   PLT 382 06/07/2022    Lab Results  Component Value Date   NA 140 06/07/2022   K 3.8 06/07/2022   CL 106 06/07/2022   CO2 21 (L) 06/07/2022   BUN 10 06/07/2022   CREATININE 0.95 06/07/2022   GLUCOSE 123 (H) 06/07/2022   ALT 11 (L) 07/19/2016    Lab Results  Component Value Date   CHOL 139 07/19/2016   HDL 41 07/19/2016   LDLCALC 80 07/19/2016   TRIG 89 07/19/2016   CHOLHDL 3.4 07/19/2016     --------------------------------------------------------------------------------------------------  ASSESSMENT AND PLAN: Unstable angina: Ms. Defiore reports at least 2 to 3 weeks of almost constant chest pain that waxes and wanes in intensity, exacerbated by minimal activity.  Her ED evaluation was unremarkable.  EKG today shows possible anterior infarct, though I suspect poor R wave progression is likely due to lead placement.  She does not have any acute ischemic changes.  She has a 2/6 holosystolic murmur on exam; she notes this is longstanding.  Recent CTA chest to exclude pulmonary embolism in the ED was unremarkable.  She does not have significant coronary artery calcification or aortic atherosclerosis.  Most notable cardiac risk factor is her father's early MI in his 73s; other risk factors include obesity and prior tobacco use.  She was visibly uncomfortable in the office and was given aspirin and sublingual nitroglycerin x 2 with resolution of chest pain.  Repeat EKG did not show any acute changes.  However, given the severity of her pain, exertional component, and prompt improvement/resolution with sublingual nitroglycerin, I am concerned that she may have unstable coronary disease.  We have discussed referring her back to the ER versus proceeding directly with cardiac catheterization.  She has opted for the latter.  We will obtain a stat CBC, BMP, and troponin while she is being prepped for catheterization.  Shortness of breath and  orthopnea: Certainly this could be due to heart failure, though Ms. Kliebert appears euvolemic.  Will proceed with left heart catheterization today.  That will be followed by an echocardiogram.  Shared Decision Making/Informed Consent The risks [stroke (1 in 1000), death (1 in 69), kidney failure [usually temporary] (1 in 500), bleeding (1 in 200), allergic reaction [possibly serious] (1 in 200)], benefits (diagnostic support and management of coronary artery disease) and alternatives of a cardiac catheterization were discussed in detail with Ms. Rosenstock and she is willing to proceed.  Follow-up: Return to clinic in 2 weeks.  Nelva Bush, MD 06/15/2022 11:07 AM

## 2022-06-15 NOTE — Patient Instructions (Signed)
Medication Instructions:  Your Physician recommend you continue on your current medication as directed.    *If you need a refill on your cardiac medications before your next appointment, please call your pharmacy*   Lab Work: Your provider would like for you to have following labs drawn: (CBC, BMP, Troponin).   Please go to the [facility] entrance and check in at the front desk.  You do not need an appointment.  They are open from 7am-6 pm.     Testing/Procedures: Your physician has requested that you have a cardiac catheterization. Cardiac catheterization is used to diagnose and/or treat various heart conditions. Doctors may recommend this procedure for a number of different reasons. The most common reason is to evaluate chest pain. Chest pain can be a symptom of coronary artery disease (CAD), and cardiac catheterization can show whether plaque is narrowing or blocking your heart's arteries. This procedure is also used to evaluate the valves, as well as measure the blood flow and oxygen levels in different parts of your heart. For further information please visit [email or web address]. Please follow instruction sheet, as given.    Follow-Up: At [facility], you and your health needs are our priority.  As part of our continuing mission to provide you with exceptional heart care, we have created designated Provider Care Teams.  These Care Teams include your primary Cardiologist (physician) and Advanced Practice Providers (APPs -  Physician Assistants and Nurse Practitioners) who all work together to provide you with the care you need, when you need it.  We recommend signing up for the patient portal called "MyChart".  Sign up information is provided on this After Visit Summary.  MyChart is used to connect with patients for Virtual Visits (Telemedicine).  Patients are able to view lab/test results, encounter notes, upcoming appointments, etc.  Non-urgent messages can be sent to  your provider as well.   To learn more about what you can do with MyChart, go to [email or web address].    Your next appointment:   2 week(s)  Provider:   You may see Nelva Bush, MD or one of the following Advanced Practice Providers on your designated Care Team:   Murray Hodgkins, NP Christell Faith, PA-C Cadence Kathlen Mody, PA-C Gerrie Nordmann, NP      [facility] [address] [place] [address] Dept: [place]: [phone]   Cardiac/Peripheral Catheterization   You are scheduled for a Cardiac Catheterization on Friday, February 9 with Dr. Harrell Gave End.  1. Arrive at the [facility] entrance at 12:30 pm, one hour prior to your procedure. Free valet service is available.  After entering the [facility] please check-in at the registration desk (1st desk on your right) to receive your armband. After receiving your armband someone will escort you to the cardiac cath/special procedures waiting area. The support person will be asked to wait in the waiting room.  It is OK to have someone drop you off and come back when you are ready to be discharged.        Special note: Every effort is made to have your procedure done on time. Please understand that emergencies sometimes delay scheduled procedures.   . 2. Diet: Do not eat solid foods after midnight.  You may have clear liquids until 5 AM the day of the procedure.  3. Labs:Today   On the morning of your procedure, take Aspirin 81 mg and any morning medicines NOT listed above.  You may use sips of water.  5. Plan to go home the same day, you will only stay overnight if medically necessary. 6. You MUST have a responsible adult to drive you home. 7. An adult MUST be with you the first 24 hours after you arrive home. 8. Bring a current list of your medications, and the last time and date medication taken. 9. Bring ID and current insurance cards. 10.Please wear clothes that are easy to get on  and off and wear slip-on shoes.  Thank you for allowing [place] to care for you!   -- [place] [facility]

## 2022-06-15 NOTE — Progress Notes (Signed)
New Outpatient Visit Date: 06/15/2022  Referring Provider: Lavonia Drafts, MD [facility] Emergency Department  Chief Complaint: Chest pain  HPI:  Desiree Nunez is a 52 y.o. female who is being seen today for the evaluation of chest pain at the request of Dr. Corky Downs. She has a history of GERD, anemia, peptic ulcer disease, uterine fibroid, OSA, and ovarian cyst.  She presented to the [facility] emergency department on 06/07/2022 complaining of chest pain that initially began the day before while walking down the stairs.  Pain gradually resolved but recurred the next morning.  ED workup was largely unrevealing other than EKG showing possible anterior infarct.  [name] presents today with waxing and waning chest pain that began the last week in January and has been present ever since.  It is typically 6-7/10 in intensity but escalates to 10/10 in intensity whenever she walks.  When it first began, she had only walked a few feet when she had sudden onset of severe chest pain and associated dizziness.  She notes that the pain gets better but never resolves when she sits down to rest.  She has tried taking acetaminophen and several NSAIDs without significant relief.  She also tried simethicone without improvement.  She notes having had a similar episode of pain in 02/2022, though it subsided on its own after 1-2 days.  She also reports orthopnea over the last 2 weeks, now sleeping on 3-4 pillows.  Ms. Desiree Nunez reports that she was born with a heart murmur but has never undergone cardiac evaluation.  She also denies a history of lung problems.  Notably, she gets short of breath with minimal activity and finds herself tripoding and breathing with pursed lips from time to time; this began around the same time as her chest pain last month.  She is a former smoker, having quit around 2018 or 2019.  She has not noticed any wheezing.  There have been no obvious precipitants for her recent chest pain  or shortness of breath other than some increase in her baseline stress.  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  --------------------------------------------------------------------------------------------------  Cardiovascular History & Procedures: Cardiovascular Problems: Atypical chest pain  Risk Factors: Obesity, family history, and prior tobacco use  Cath/PCI: None  CV Surgery: None  EP Procedures and Devices: None  Non-Invasive Evaluation(s): None  Recent CV Pertinent Labs: Lab Results  Component Value Date   CHOL 139 07/19/2016   HDL 41 07/19/2016   LDLCALC 80 07/19/2016   TRIG 89 07/19/2016   CHOLHDL 3.4 07/19/2016   INR 0.95 09/10/2016   K 3.8 06/07/2022   MG 2.2 07/19/2016   BUN 10 06/07/2022   CREATININE 0.95 06/07/2022    --------------------------------------------------------------------------------------------------  Past Medical History:  Diagnosis Date   Anemia    GERD (gastroesophageal reflux disease)    Ovarian cyst    Uterine fibroid     Past Surgical History:  Procedure Laterality Date   IR ANGIOGRAM PELVIS SELECTIVE OR SUPRASELECTIVE  09/10/2016   IR ANGIOGRAM PELVIS SELECTIVE OR SUPRASELECTIVE  09/10/2016   IR ANGIOGRAM SELECTIVE EACH ADDITIONAL VESSEL  09/10/2016   IR ANGIOGRAM SELECTIVE EACH ADDITIONAL VESSEL  09/10/2016   IR EMBO TUMOR ORGAN ISCHEMIA INFARCT INC GUIDE ROADMAPPING  09/10/2016   IR RADIOLOGIST EVAL & MGMT  07/31/2016   IR RADIOLOGIST EVAL & MGMT  10/30/2016   IR US GUIDE VASC ACCESS RIGHT  09/10/2016   LAPAROSCOPY  remove Ovarian Cyst     Current Meds  Medication Sig   acetaminophen (TYLENOL) 325 MG tablet Take 650 mg by mouth as needed.   buPROPion HCl (WELLBUTRIN PO) Take 150 mg by mouth daily.   celecoxib (CELEBREX) 200 MG capsule Take 200 mg by mouth daily as needed.   ibuprofen (ADVIL) 200 MG tablet Take 200 mg by mouth as needed.    Allergies: Strawberry  flavor  Social History   Tobacco Use   Smoking status: Former    Packs/day: 0.50    Years: 33.00    Total pack years: 16.50    Types: Cigarettes    Quit date: 02/2018    Years since quitting: 4.3   Smokeless tobacco: Never  Vaping Use   Vaping Use: Former   Start date: 05/07/2014   Quit date: 07/06/2014  Substance Use Topics   Alcohol use: Yes    Comment: 1-2 drinks/year   Drug use: No    Family History  Problem Relation Age of Onset   CAD Father    Heart attack Father 68   Stroke Father    Diabetes Sister    Hypertension Sister    Pulmonary embolism Sister    Fibroids Sister    Diabetes Other    Cancer Neg Hx     Review of Systems: A 12-system review of systems was performed and was negative except as noted in the HPI.  --------------------------------------------------------------------------------------------------  Physical Exam: BP (!) 140/80 (BP Location: Right Arm, Patient Position: Sitting, Cuff Size: Large)   Pulse (!) 53   Ht 5' 9"$  (1.753 m)   Wt 235 lb (106.6 kg)   SpO2 99%   BMI 34.70 kg/m   General: Initially comfortable appearing woman, though at the [place] Q[place] of our visit, she was in visible pain. HEENT: No conjunctival pallor or scleral icterus. Neck: Supple without lymphadenopathy, thyromegaly, JVD, or HJR. No carotid bruit. Lungs: Normal work of breathing. Clear to auscultation bilaterally without wheezes or crackles. Heart: Regular rate and rhythm with 2/6 holosystolic murmur.  No rubs or gallops. Abd: Bowel sounds present. Soft, NT/ND without hepatosplenomegaly Ext: No lower extremity edema. Radial, PT, and DP pulses are 2+ bilaterally Skin: Warm and dry without rash. Neuro: CNIII-XII intact. Strength and fine-touch sensation intact in upper and lower extremities bilaterally. Psych: Normal mood and affect.  EKG: Normal sinus rhythm with possible anterior infarct.  Lab Results  Component Value Date   WBC 7.4 06/07/2022   HGB 14.7 06/07/2022    HCT 44.6 06/07/2022   MCV 93.1 06/07/2022   PLT 382 06/07/2022    Lab Results  Component Value Date   NA 140 06/07/2022   K 3.8 06/07/2022   CL 106 06/07/2022   CO2 21 (L) 06/07/2022   BUN 10 06/07/2022   CREATININE 0.95 06/07/2022   GLUCOSE 123 (H) 06/07/2022   ALT 11 (L) 07/19/2016    Lab Results  Component Value Date   CHOL 139 07/19/2016   HDL 41 07/19/2016   LDLCALC 80 07/19/2016   TRIG 89 07/19/2016   CHOLHDL 3.4 07/19/2016     --------------------------------------------------------------------------------------------------  ASSESSMENT AND PLAN: Unstable angina: Ms. Blaydes reports at least 2 to 3 weeks of almost constant chest pain that waxes and wanes in intensity, exacerbated by minimal activity.  Her ED evaluation was unremarkable.  EKG today shows possible anterior infarct, though I suspect poor R wave progression is likely due to lead placement.  She does not have any acute ischemic changes.  She has a 2/6 holosystolic murmur on exam; she notes this is longstanding.  Recent CTA chest to exclude pulmonary embolism in the ED was unremarkable.  She does not have significant coronary artery calcification or aortic atherosclerosis.  Most notable cardiac risk factor is her father's early MI in his 35s; other risk factors include obesity and prior tobacco use.  She was visibly uncomfortable in the office and was given aspirin and sublingual nitroglycerin x 2 with resolution of chest pain.  Repeat EKG did not show any acute changes.  However, given the severity of her pain, exertional component, and prompt improvement/resolution with sublingual nitroglycerin, I am concerned that she may have unstable coronary disease.  We have discussed referring her back to the ER versus proceeding directly with cardiac catheterization.  She has opted for the latter.  We will obtain a stat CBC, BMP, and troponin while she is being prepped for catheterization.  Shortness of breath and  orthopnea: Certainly this could be due to heart failure, though Ms. Krager appears euvolemic.  Will proceed with left heart catheterization today.  That will be followed by an echocardiogram.  Shared Decision Making/Informed Consent The risks [stroke (1 in 1000), death (1 in 13), kidney failure [usually temporary] (1 in 500), bleeding (1 in 200), allergic reaction [possibly serious] (1 in 200)], benefits (diagnostic support and management of coronary artery disease) and alternatives of a cardiac catheterization were discussed in detail with Ms. Triplett and she is willing to proceed.  Follow-up: Return to clinic in 2 weeks.  Nelva Bush, MD 06/15/2022 11:07 AM

## 2022-06-15 NOTE — Interval H&P Note (Signed)
History and Physical Interval Note:  06/15/2022 1:57 PM  Desiree Nunez  has presented today for surgery, with the diagnosis of unstable angina.  The various methods of treatment have been discussed with the patient and family. After consideration of risks, benefits and other options for treatment, the patient has consented to  Procedure(s): LEFT HEART CATH AND CORONARY ANGIOGRAPHY (Left) as a surgical intervention.  The patient's history has been reviewed, patient examined, no change in status, stable for surgery.  I have reviewed the patient's chart and labs.  Questions were answered to the patient's satisfaction.    Cath Lab Visit (complete for each Cath Lab visit)  Clinical Evaluation Leading to the Procedure:   ACS: Yes.    Non-ACS:  N/A  Margarita Bobrowski

## 2022-06-18 ENCOUNTER — Telehealth: Payer: Self-pay | Admitting: Internal Medicine

## 2022-06-18 ENCOUNTER — Encounter: Payer: Self-pay | Admitting: Internal Medicine

## 2022-06-18 NOTE — Telephone Encounter (Signed)
She can proceed with dental cleaning as planned.  She can return to work on Wednesday.  Nelva Bush, MD [facility]

## 2022-06-18 NOTE — Telephone Encounter (Signed)
Patient would like to know when she can go back to work, and is she ok to keep her deep cleaning dental appointment this week. Please adivse.

## 2022-06-18 NOTE — Telephone Encounter (Signed)
Pt made aware of MD's recommendations and verbalized understanding.

## 2022-07-02 ENCOUNTER — Ambulatory Visit: Admitting: Cardiology

## 2022-07-04 ENCOUNTER — Ambulatory Visit
Admission: RE | Admit: 2022-07-04 | Discharge: 2022-07-04 | Disposition: A | Discharge status: Home / Self Care | Source: Ambulatory Visit | Attending: Internal Medicine | Admitting: Internal Medicine

## 2022-07-04 DIAGNOSIS — D649 Anemia, unspecified: Secondary | ICD-10-CM | POA: Diagnosis not present

## 2022-07-04 DIAGNOSIS — R0602 Shortness of breath: Secondary | ICD-10-CM | POA: Diagnosis not present

## 2022-07-04 DIAGNOSIS — R079 Chest pain, unspecified: Secondary | ICD-10-CM | POA: Diagnosis present

## 2022-07-04 LAB — ECHOCARDIOGRAM COMPLETE
AR max vel: 2.47 cm2
AV Area VTI: 2.91 cm2
AV Area mean vel: 2.33 cm2
AV Mean grad: 5 mmHg
AV Peak grad: 8.9 mmHg
Ao pk vel: 1.49 m/s
Area-P 1/2: 3.11 cm2
S' Lateral: 2.7 cm

## 2022-07-04 NOTE — Progress Notes (Signed)
*  PRELIMINARY RESULTS* Echocardiogram 2D Echocardiogram has been performed.  Desiree Nunez 07/04/2022, 10:01 AM

## 2022-07-16 ENCOUNTER — Encounter: Payer: Self-pay | Admitting: Medical

## 2022-07-16 ENCOUNTER — Ambulatory Visit: Attending: Cardiology | Admitting: Medical

## 2022-07-16 VITALS — BP 138/70 | HR 71 | Ht 69.0 in | Wt 235.6 lb

## 2022-07-16 DIAGNOSIS — I10 Essential (primary) hypertension: Secondary | ICD-10-CM

## 2022-07-16 DIAGNOSIS — R0602 Shortness of breath: Secondary | ICD-10-CM | POA: Diagnosis not present

## 2022-07-16 DIAGNOSIS — I200 Unstable angina: Secondary | ICD-10-CM

## 2022-07-16 MED ORDER — PANTOPRAZOLE SODIUM 20 MG PO TBEC: 20.0000 mg | DELAYED_RELEASE_TABLET | Freq: Every day | ORAL | 2 refills | Status: AC

## 2022-07-16 MED ORDER — ISOSORBIDE MONONITRATE ER 30 MG PO TB24: 15.0000 mg | ORAL_TABLET | Freq: Every day | ORAL | 2 refills | Status: AC

## 2022-07-16 NOTE — Patient Instructions (Addendum)
Medication Instructions:   START Isosorbide (IMDUR) 15 mg by mouth once daily. (Please cut 30 mg tablet in 1/2 once daily)  START Protonix 20 mg by mouth once daily.  *If you need a refill on your cardiac medications before your next appointment, please call your pharmacy*   Lab Work:  NONE  If you have labs (blood work) drawn today and your tests are completely normal, you will receive your results only by: [place] (if you have MyChart) OR A paper copy in the mail If you have any lab test that is abnormal or we need to change your treatment, we will call you to review the results.   Testing/Procedures:  NONE   Follow-Up: At [facility], you and your health needs are our priority.  As part of our continuing mission to provide you with exceptional heart care, we have created designated Provider Care Teams.  These Care Teams include your primary Cardiologist (physician) and Advanced Practice Providers (APPs -  Physician Assistants and Nurse Practitioners) who all work together to provide you with the care you need, when you need it.  We recommend signing up for the patient portal called "MyChart".  Sign up information is provided on this After Visit Summary.  MyChart is used to connect with patients for Virtual Visits (Telemedicine).  Patients are able to view lab/test results, encounter notes, upcoming appointments, etc.  Non-urgent messages can be sent to your provider as well.   To learn more about what you can do with MyChart, go to [email or web address].    Your next appointment:   4-6  week(s)  Provider:   You may see Nelva Bush, MD or one of the following Advanced Practice Providers on your designated Care Team:   Murray Hodgkins, NP Christell Faith, PA-C Cadence Kathlen Mody, PA-C Gerrie Nordmann, NP

## 2022-07-16 NOTE — Progress Notes (Signed)
Cardiology Office Note:    Date:  07/16/2022   ID:  Desiree Nunez, DOB November 10, 1970, MRN [id number]  PCP:  Lady Gary, [facility] Cardiologist:  Nelva Bush, MD  [facility] Electrophysiologist:  None   Referring MD: Lady Gary, Physicians *   Chief Complaint: chest pain  History of Present Illness:    Desiree Nunez is a 52 y.o. female with a hx of GERD, anemia, PUD, uterine fibroid, OSA, and ovarian cyst. She was seen in the ER 06/07/22 for chest pain.   She presented to [facility] ER on June 07, 2022 with chest pain.  Work up in the ER showed possible anterior infarct on the EKG, otherwise workup was largely unrevealing. chest CT noted coronary artery calcification or aortic atherosclerosis.  She was seen in the office 06/15/22 for unstable angina. During the visit, she had active angina and was given NTG SL x 2 in the office. She was taken urgently to the cath lab. Cardiac cath 06/15/22 showed no Significant CAD, LVEF 65%, mild-moderately elevated LV outflow tract gradient confounded by significant respiratory variation. Amlodipine was started for possible microvascular dysfunction/coronary vasospasm. Echo showed LVEF 60-65%, no WMA, G1DD, normal RVSF.   Today, the patient reports recurrent chest pain. She has needed 5 SL NTG since the last visit, with complete resolution of pain.  She did not work last week and had no pain last week. Chest pain is intermittent and starts in the back and moves forward to the middle of her chest. If she overexerts herself or is frustrated she feels the chest pain. No active chest pain today during the visit. When she has chest pain, blood pressure is higher. 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 Job is stressful, and this may be contributing to chest pain. H/o GERD, not on anti-acid medication.  Cath site is stable.  Past Medical History:  Diagnosis Date   Anemia    GERD (gastroesophageal reflux disease)    Ovarian  cyst    Uterine fibroid     Past Surgical History:  Procedure Laterality Date   IR ANGIOGRAM PELVIS SELECTIVE OR SUPRASELECTIVE  09/10/2016   IR ANGIOGRAM PELVIS SELECTIVE OR SUPRASELECTIVE  09/10/2016   IR ANGIOGRAM SELECTIVE EACH ADDITIONAL VESSEL  09/10/2016   IR ANGIOGRAM SELECTIVE EACH ADDITIONAL VESSEL  09/10/2016   IR EMBO TUMOR ORGAN ISCHEMIA INFARCT INC GUIDE ROADMAPPING  09/10/2016   IR RADIOLOGIST EVAL & MGMT  07/31/2016   IR RADIOLOGIST EVAL & MGMT  10/30/2016   IR US GUIDE VASC ACCESS RIGHT  09/10/2016   LAPAROSCOPY     remove Ovarian Cyst    LEFT HEART CATH AND CORONARY ANGIOGRAPHY Left 06/15/2022   Procedure: LEFT HEART CATH AND CORONARY ANGIOGRAPHY;  Surgeon: Nelva Bush, MD;  Location: [place] CV LAB;  Service: Cardiovascular;  Laterality: Left;    Current Medications: Current Meds  Medication Sig   acetaminophen (TYLENOL) 325 MG tablet Take 650 mg by mouth as needed.   amLODipine (NORVASC) 2.5 MG tablet Take 1 tablet (2.5 mg total) by mouth daily.   buPROPion HCl (WELLBUTRIN PO) Take 150 mg by mouth daily.   celecoxib (CELEBREX) 200 MG capsule Take 200 mg by mouth daily as needed.   isosorbide mononitrate (IMDUR) 30 MG 24 hr tablet Take 0.5 tablets (15 mg total) by mouth daily.   nitroGLYCERIN (NITROSTAT) 0.4 MG SL tablet Place 1 tablet (0.4 mg total) under the tongue every 5 (five) minutes as needed for chest  pain.   pantoprazole (PROTONIX) 20 MG tablet Take 1 tablet (20 mg total) by mouth daily.   Current Facility-Administered Medications for the 07/16/22 encounter (Office Visit) with Kathlen Mody, Zailee Vallely H, PA-C  Medication   nitroGLYCERIN (NITROSTAT) SL tablet 0.4 mg     Allergies:   Strawberry flavor   Social History   Socioeconomic History   Marital status: Married    Spouse name: Not on file   Number of children: Not on file   Years of education: Not on file   Highest education level: Not on file  Occupational History   Not on file  Tobacco Use   Smoking  status: Former    Packs/day: 0.50    Years: 33.00    Total pack years: 16.50    Types: Cigarettes    Quit date: 02/2018    Years since quitting: 4.4   Smokeless tobacco: Never  Vaping Use   Vaping Use: Former   Start date: 05/07/2014   Quit date: 07/06/2014  Substance and Sexual Activity   Alcohol use: Yes    Comment: 1-2 drinks/year   Drug use: No   Sexual activity: Not on file  Other Topics Concern   Not on file  Social History Narrative   Not on file   Social Determinants of Health   Financial Resource Strain: Not on file  Food Insecurity: Not on file  Transportation Needs: Not on file  Physical Activity: Not on file  Stress: Not on file  Social Connections: Not on file     Family History: The patient's family history includes CAD in her father; Diabetes in her sister and another family member; Fibroids in her sister; Heart attack (age of onset: 69) in her father; Hypertension in her sister; Pulmonary embolism in her sister; Stroke in her father. There is no history of Cancer.  ROS:   Please see the history of present illness.     All other systems reviewed and are negative.  EKGs/Labs/Other Studies Reviewed:    The following studies were reviewed today:  Echo 06/2022  1. Left ventricular ejection fraction, by estimation, is 60 to 65%. The  left ventricle has normal function. The left ventricle has no regional  wall motion abnormalities. Left ventricular diastolic parameters are  consistent with Grade I diastolic  dysfunction (impaired relaxation).   2. Right ventricular systolic function is normal. The right ventricular  size is normal.   3. The mitral valve is normal in structure. No evidence of mitral valve  regurgitation.   4. The aortic valve is tricuspid. Aortic valve regurgitation is not  visualized.   5. The inferior vena cava is normal in size with greater than 50%  respiratory variability, suggesting right atrial pressure of 3 mmHg.   Cardiac cath  06/2022 Conclusions: No angiographically significant coronary artery disease.  Question if a component of vasospasm or microvascular dysfunction is contributing to the patient's chest pain (versus some noncardiac etiology). Hyperdynamic left ventricular systolic function (LVEF greater than 65%) with upper normal filling pressure. Mild-moderately elevated left ventricular outflow tract gradient confounded by significant respiratory variation. Significant right radial artery vasospasm; consider alternative access for future catheterizations.   Recommendations: Primary prevention of coronary artery disease. Empiric trial of amlodipine 2.5 mg daily for possible component of microvascular dysfunction/coronary vasospasm as well as blood pressure control. Obtain echocardiogram for further evaluation of LVOT gradient noted above and longstanding systolic heart murmur.   Nelva Bush, MD [facility]    EKG:  EKG is  ordered today.  The ekg ordered today demonstrates normal sinus rhythm, 71 bpm, no significant changes  Recent Labs: 06/15/2022: BUN 10; Creatinine, Ser 0.89; Hemoglobin 13.9; Platelets 370; Potassium 3.4; Sodium 135  Recent Lipid Panel    Component Value Date/Time   CHOL 139 07/19/2016 0400   TRIG 89 07/19/2016 0400   HDL 41 07/19/2016 0400   CHOLHDL 3.4 07/19/2016 0400   VLDL 18 07/19/2016 0400   LDLCALC 80 07/19/2016 0400    Physical Exam:    VS:  BP 138/70 (BP Location: Left Arm, Patient Position: Sitting, Cuff Size: Normal)   Pulse 71   Ht '5\' 9"'$  (1.753 m)   Wt 235 lb 9.6 oz (106.9 kg)   SpO2 99%   BMI 34.79 kg/m     Wt Readings from Last 3 Encounters:  07/16/22 235 lb 9.6 oz (106.9 kg)  06/15/22 235 lb (106.6 kg)  06/15/22 235 lb (106.6 kg)     GEN:  Well nourished, well developed in no acute distress HEENT: Normal NECK: No JVD; No carotid bruits LYMPHATICS: No lymphadenopathy CARDIAC: RRR, no murmurs, rubs, gallops RESPIRATORY:  Clear to auscultation  without rales, wheezing or rhonchi  ABDOMEN: Soft, non-tender, non-distended MUSCULOSKELETAL:  No edema; No deformity  SKIN: Warm and dry NEUROLOGIC:  Alert and oriented x 3 PSYCHIATRIC:  Normal affect   ASSESSMENT:    1. Unstable angina ([place])   2. SOB (shortness of breath)   3. Essential hypertension    PLAN:    In order of problems listed above:  Unstable Angina Recent LHC showed no significant CAD.  He was started on amlodipine 2.5 mg daily for possible component of microvascular dysfunction, coronary vasospasms. She still is having intermittent chest pain and has used sublingual nitroglycerin 5 times with resolution of chest pain.  Her last episode was last Monday.  She did not work last week, and had no chest pain last week.  There may be a component of anxiety and stress contributing to symptoms.  Her job is very stressful.  Patient also has a history of acid reflux not on medication at this time.  Echo showed normal LVEF with grade 1 diastolic dysfunction, no other abnormalities.  When patient has chest pain blood pressure can be elevated.  Patient reported minimal improvement of chest pain with amlodipine 2.5 mg daily.  Given improvement of pain with nitro, I will start Imdur 15 mg daily.  I will also start Protonix 20 mg daily.  Patient has follow-up with PCP this week.  I will see her back in 4 to 6 weeks to reevaluate symptoms and blood pressure.  HTN Blood pressure today 138/70.  Patient is taking amlodipine 2.5 mg daily.  Start Imdur 15 mg daily as above.  Will reevaluate blood pressure at follow-up.  Disposition: Follow up in 4-6 week(s) with MD/APP     Signed, Leialoha Hanna Ninfa Meeker, PA-C  07/16/2022 11:32 AM    [facility]

## 2022-07-20 ENCOUNTER — Telehealth: Payer: Self-pay | Admitting: Internal Medicine

## 2022-07-20 NOTE — Telephone Encounter (Signed)
   Pre-operative Risk Assessment    Patient Name: Desiree Nunez  DOB: October 21, 1970 MRN: [id number]      Request for Surgical Clearance    Procedure:   Deep Cleaning  Date of Surgery:  Clearance 07/20/22                                 Surgeon:  Leafy Half( Dental Hygienist) Surgeon's Group or Practice Name:  [facility] Phone number:  [phone] Fax number:  [phone]   Type of Clearance Requested:   - Medical    Type of Anesthesia:  Local    Additional requests/questions:   Are they able to use septocaine and lidocaine since both of them have epinephrine in them.   Karie Soda   07/20/2022, 9:22 AM

## 2022-07-20 NOTE — Telephone Encounter (Signed)
   Patient Name: Desiree Nunez  DOB: 10/14/1970 MRN: [id number]  Primary Cardiologist: Nelva Bush, MD  Chart reviewed as part of pre-operative protocol coverage.   Simple dental extractions (i.e. 1-2 teeth), dental cleanings are considered low risk procedures per guidelines and generally do not require any specific cardiac clearance. It is also generally accepted that for simple extractions and dental cleanings, there is no need to interrupt blood thinner therapy.  SBE prophylaxis is not required for the patient from a cardiac standpoint.  I will route this recommendation to the requesting party via Epic fax function and remove from pre-op pool.  Please call with questions.  Lenna Sciara, NP 07/20/2022, 9:51 AM

## 2022-08-27 ENCOUNTER — Encounter: Payer: Self-pay | Admitting: Medical

## 2022-08-27 ENCOUNTER — Ambulatory Visit: Attending: Medical | Admitting: Medical

## 2022-08-27 VITALS — BP 122/72 | HR 66 | Ht 69.0 in | Wt 232.6 lb

## 2022-08-27 DIAGNOSIS — I200 Unstable angina: Secondary | ICD-10-CM

## 2022-08-27 DIAGNOSIS — I10 Essential (primary) hypertension: Secondary | ICD-10-CM | POA: Diagnosis not present

## 2022-08-27 NOTE — Patient Instructions (Signed)
Medication Instructions:  Your physician recommends that you continue on your current medications as directed. Please refer to the Current Medication list given to you today.  *If you need a refill on your cardiac medications before your next appointment, please call your pharmacy*  Lab Work: -None ordered If you have labs (blood work) drawn today and your tests are completely normal, you will receive your results only by: MyChart Message (if you have MyChart) OR A paper copy in the mail If you have any lab test that is abnormal or we need to change your treatment, we will call you to review the results.  Testing/Procedures: -None ordered  Follow-Up: At [facility], you and your health needs are our priority.  As part of our continuing mission to provide you with exceptional heart care, we have created designated Provider Care Teams.  These Care Teams include your primary Cardiologist (physician) and Advanced Practice Providers (APPs -  Physician Assistants and Nurse Practitioners) who all work together to provide you with the care you need, when you need it.  We recommend signing up for the patient portal called "MyChart".  Sign up information is provided on this After Visit Summary.  MyChart is used to connect with patients for Virtual Visits (Telemedicine).  Patients are able to view lab/test results, encounter notes, upcoming appointments, etc.  Non-urgent messages can be sent to your provider as well.   To learn more about what you can do with MyChart, go to [email or web address].    Your next appointment:   6 month(s)  Provider:   You may see Christopher End, MD or one of the following Advanced Practice Providers on your designated Care Team:   Christopher Berge, NP Ryan Dunn, PA-C Cadence Furth, PA-C Sheri Hammock, NP   Other Instructions -None  

## 2022-08-27 NOTE — Progress Notes (Signed)
Cardiology Office Note:    Date:  08/27/2022   ID:  Desiree Nunez, DOB 09/06/1970, MRN [id number]  PCP:  Ginette Otto, [facility] Cardiologist:  Yvonne Kendall, MD  [facility] Electrophysiologist:  None   Referring MD: Ginette Otto, Physicians *   Chief Complaint: 1 month follow-up  History of Present Illness:    Desiree Nunez is a 52 y.o. female with a hx of GERD, anemia, PUD, uterine fibroid, OSA, and ovarian cyst who presents for chest pain follow-up.    She presented to [facility] ER on June 07, 2022 with chest pain.  Work up in the ER showed possible anterior infarct on the EKG, otherwise workup was largely unrevealing. chest CT noted coronary artery calcification or aortic atherosclerosis.   She was seen in the office 06/15/22 for unstable angina. During the visit, she had active angina and was given NTG SL x 2 in the office. She was taken urgently to the cath lab. Cardiac cath 06/15/22 showed no Significant CAD, LVEF 65%, mild-moderately elevated LV outflow tract gradient confounded by significant respiratory variation. Amlodipine was started for possible microvascular dysfunction/coronary vasospasm. Echo showed LVEF 60-65%, no WMA, G1DD, normal RVSF.   Patient was last seen in March 2024 reporting recurrent chest pain requiring sublingual nitro.  Patient was started on Imdur and Protonix.  Today, the patient reported 1 episode of chest pain while angry and under stress. On Friday, she had an episode of chest pain  while she was angry on phone with [employer]. She did not take NTG. Since the last visit she did leave her job and started working a new job with a 12 hour shift, which has been better for her stress. Breathing is good. No lower leg edema. She has allergies. She is trying to eat healthier, more plant based items.   Past Medical History:  Diagnosis Date   Anemia    GERD (gastroesophageal reflux disease)    Ovarian cyst    Uterine fibroid     Past  Surgical History:  Procedure Laterality Date   IR ANGIOGRAM PELVIS SELECTIVE OR SUPRASELECTIVE  09/10/2016   IR ANGIOGRAM PELVIS SELECTIVE OR SUPRASELECTIVE  09/10/2016   IR ANGIOGRAM SELECTIVE EACH ADDITIONAL VESSEL  09/10/2016   IR ANGIOGRAM SELECTIVE EACH ADDITIONAL VESSEL  09/10/2016   IR EMBO TUMOR ORGAN ISCHEMIA INFARCT INC GUIDE ROADMAPPING  09/10/2016   IR RADIOLOGIST EVAL & MGMT  07/31/2016   IR RADIOLOGIST EVAL & MGMT  10/30/2016   IR US GUIDE VASC ACCESS RIGHT  09/10/2016   LAPAROSCOPY     remove Ovarian Cyst    LEFT HEART CATH AND CORONARY ANGIOGRAPHY Left 06/15/2022   Procedure: LEFT HEART CATH AND CORONARY ANGIOGRAPHY;  Surgeon: Yvonne Kendall, MD;  Location: [facility] INVASIVE CV LAB;  Service: Cardiovascular;  Laterality: Left;    Current Medications: Current Meds  Medication Sig   acetaminophen (TYLENOL) 325 MG tablet Take 650 mg by mouth as needed.   amLODipine (NORVASC) 2.5 MG tablet Take 1 tablet (2.5 mg total) by mouth daily.   buPROPion HCl (WELLBUTRIN PO) Take 150 mg by mouth daily.   celecoxib (CELEBREX) 200 MG capsule Take 200 mg by mouth daily as needed.   isosorbide mononitrate (IMDUR) 30 MG 24 hr tablet Take 0.5 tablets (15 mg total) by mouth daily.   nitroGLYCERIN (NITROSTAT) 0.4 MG SL tablet Place 1 tablet (0.4 mg total) under the tongue every 5 (five) minutes as needed for chest pain.   pantoprazole (PROTONIX) 20 MG  tablet Take 1 tablet (20 mg total) by mouth daily.   Current Facility-Administered Medications for the 08/27/22 encounter (Office Visit) with Fransico Michael, Armari Fussell H, PA-C  Medication   nitroGLYCERIN (NITROSTAT) SL tablet 0.4 mg     Allergies:   Strawberry flavor   Social History   Socioeconomic History   Marital status: Married    Spouse name: Not on file   Number of children: Not on file   Years of education: Not on file   Highest education level: Not on file  Occupational History   Not on file  Tobacco Use   Smoking status: Former    Packs/day: 0.50     Years: 33.00    Additional pack years: 0.00    Total pack years: 16.50    Types: Cigarettes    Quit date: 02/2018    Years since quitting: 4.5   Smokeless tobacco: Never  Vaping Use   Vaping Use: Former   Start date: 05/07/2014   Quit date: 07/06/2014  Substance and Sexual Activity   Alcohol use: Yes    Comment: 1-2 drinks/year   Drug use: No   Sexual activity: Not on file  Other Topics Concern   Not on file  Social History Narrative   Not on file   Social Determinants of Health   Financial Resource Strain: Not on file  Food Insecurity: Not on file  Transportation Needs: Not on file  Physical Activity: Not on file  Stress: Not on file  Social Connections: Not on file     Family History: The patient's family history includes CAD in her father; Diabetes in her sister and another family member; Fibroids in her sister; Heart attack (age of onset: 47) in her father; Hypertension in her sister; Pulmonary embolism in her sister; Stroke in her father. There is no history of Cancer.  ROS:   Please see the history of present illness.     All other systems reviewed and are negative.  EKGs/Labs/Other Studies Reviewed:    The following studies were reviewed today:  Echo 06/2022  1. Left ventricular ejection fraction, by estimation, is 60 to 65%. The  left ventricle has normal function. The left ventricle has no regional  wall motion abnormalities. Left ventricular diastolic parameters are  consistent with Grade I diastolic  dysfunction (impaired relaxation).   2. Right ventricular systolic function is normal. The right ventricular  size is normal.   3. The mitral valve is normal in structure. No evidence of mitral valve  regurgitation.   4. The aortic valve is tricuspid. Aortic valve regurgitation is not  visualized.   5. The inferior vena cava is normal in size with greater than 50%  respiratory variability, suggesting right atrial pressure of 3 mmHg.    Cardiac cath  06/2022 Conclusions: No angiographically significant coronary artery disease.  Question if a component of vasospasm or microvascular dysfunction is contributing to the patient's chest pain (versus some noncardiac etiology). Hyperdynamic left ventricular systolic function (LVEF greater than 65%) with upper normal filling pressure. Mild-moderately elevated left ventricular outflow tract gradient confounded by significant respiratory variation. Significant right radial artery vasospasm; consider alternative access for future catheterizations.   Recommendations: Primary prevention of coronary artery disease. Empiric trial of amlodipine 2.5 mg daily for possible component of microvascular dysfunction/coronary vasospasm as well as blood pressure control. Obtain echocardiogram for further evaluation of LVOT gradient noted above and longstanding systolic heart murmur.   Yvonne Kendall, MD [facility]  EKG:  EKG is not  ordered today.    Recent Labs: 06/15/2022: BUN 10; Creatinine, Ser 0.89; Hemoglobin 13.9; Platelets 370; Potassium 3.4; Sodium 135  Recent Lipid Panel    Component Value Date/Time   CHOL 139 07/19/2016 0400   TRIG 89 07/19/2016 0400   HDL 41 07/19/2016 0400   CHOLHDL 3.4 07/19/2016 0400   VLDL 18 07/19/2016 0400   LDLCALC 80 07/19/2016 0400    Physical Exam:    VS:  BP 122/72 (BP Location: Left Arm, Patient Position: Sitting, Cuff Size: Large)   Pulse 66   Ht  (1.753 m)   Wt 232 lb 9.6 oz (105.5 kg)   SpO2 98%   BMI 34.35 kg/m     Wt Readings from Last 3 Encounters:  08/27/22 232 lb 9.6 oz (105.5 kg)  07/16/22 235 lb 9.6 oz (106.9 kg)  06/15/22 235 lb (106.6 kg)     GEN:  Well nourished, well developed in no acute distress HEENT: Normal NECK: No JVD; No carotid bruits LYMPHATICS: No lymphadenopathy CARDIAC: RRR, no murmurs, rubs, gallops RESPIRATORY:  Clear to auscultation without rales, wheezing or rhonchi  ABDOMEN: Soft, non-tender,  non-distended MUSCULOSKELETAL:  No edema; No deformity  SKIN: Warm and dry NEUROLOGIC:  Alert and oriented x 3 PSYCHIATRIC:  Normal affect   ASSESSMENT:    1. Unstable angina   2. Essential hypertension    PLAN:    In order of problems listed above:  Unstable Angina Patient had an episode of the evaluation in the office and was taken for emergent left heart cath 06/15/22. LHC showed no significant CAD. She was started on amlodipine 2.5 mg daily for possible component of microvascular dysfunction, coronary vasospasms.  Patient had recurrent chest pain improved with nitroglycerin and she was started on Imdur 15 mg daily.  Echo showed normal pump function with grade 1 diastolic dysfunction.  She was also started on Protonix 20 mg daily.  Overall, symptoms have improved over the last month.  She did have one episode of chest pain in the setting of stress and frustration. Since symptoms are overall better, no further ischemic work-up at this time. Continue Imdur  daily, Protonix, and amlodipine.   HTN BP is better today. Continue amlodipine 2.5mg  daily and Imdur  daily.   Disposition: Follow up in 6 month(s) with MD/APP     Signed, Jayliani Wanner David Stall, PA-C  08/27/2022 8:53 AM    [facility]
